# Patient Record
Sex: Female | Born: 1975 | Race: White | Hispanic: No | Marital: Married | Smoking: Never smoker
Health system: Southern US, Community
[De-identification: ages and names within clinical notes are randomized; demographics above are authoritative.]

## PROBLEM LIST (undated history)

## (undated) ENCOUNTER — Inpatient Hospital Stay (HOSPITAL_COMMUNITY): Payer: Self-pay

## (undated) DIAGNOSIS — O24313 Unspecified pre-existing diabetes mellitus in pregnancy, third trimester: Secondary | ICD-10-CM

## (undated) DIAGNOSIS — E0590 Thyrotoxicosis, unspecified without thyrotoxic crisis or storm: Secondary | ICD-10-CM

## (undated) DIAGNOSIS — O24419 Gestational diabetes mellitus in pregnancy, unspecified control: Secondary | ICD-10-CM

## (undated) DIAGNOSIS — O24420 Gestational diabetes mellitus in childbirth, diet controlled: Secondary | ICD-10-CM

## (undated) DIAGNOSIS — O139 Gestational [pregnancy-induced] hypertension without significant proteinuria, unspecified trimester: Secondary | ICD-10-CM

## (undated) DIAGNOSIS — Z98891 History of uterine scar from previous surgery: Secondary | ICD-10-CM

## (undated) HISTORY — PX: APPENDECTOMY: SHX54

---

## 2011-09-28 DIAGNOSIS — O139 Gestational [pregnancy-induced] hypertension without significant proteinuria, unspecified trimester: Secondary | ICD-10-CM

## 2011-09-28 DIAGNOSIS — O24419 Gestational diabetes mellitus in pregnancy, unspecified control: Secondary | ICD-10-CM

## 2011-09-28 HISTORY — DX: Gestational (pregnancy-induced) hypertension without significant proteinuria, unspecified trimester: O13.9

## 2011-09-28 HISTORY — DX: Gestational diabetes mellitus in pregnancy, unspecified control: O24.419

## 2012-05-17 DIAGNOSIS — O24419 Gestational diabetes mellitus in pregnancy, unspecified control: Secondary | ICD-10-CM

## 2012-05-17 DIAGNOSIS — O1490 Unspecified pre-eclampsia, unspecified trimester: Secondary | ICD-10-CM

## 2014-06-28 ENCOUNTER — Telehealth: Payer: Self-pay | Admitting: Obstetrics

## 2014-06-28 NOTE — Telephone Encounter (Signed)
Husband called to set up a NOB appt but pt has Family planing medicaid pt needs to change medicaid. LVM to husband on 09/18@1 :06pm   [place] [employer]

## 2014-07-07 ENCOUNTER — Inpatient Hospital Stay (HOSPITAL_COMMUNITY): Payer: Medicaid Other

## 2014-07-07 ENCOUNTER — Encounter (HOSPITAL_COMMUNITY): Payer: Self-pay | Admitting: *Deleted

## 2014-07-07 ENCOUNTER — Encounter (HOSPITAL_COMMUNITY)
Admission: AD | Disposition: A | Discharge status: Home / Self Care | Payer: Self-pay | Source: Ambulatory Visit | Attending: Family Medicine

## 2014-07-07 ENCOUNTER — Observation Stay (HOSPITAL_COMMUNITY)
Admission: AD | Admit: 2014-07-07 | Discharge: 2014-07-07 | Disposition: A | Discharge status: Home / Self Care | Payer: Medicaid Other | Source: Ambulatory Visit | Attending: Family Medicine | Admitting: Family Medicine

## 2014-07-07 ENCOUNTER — Observation Stay (HOSPITAL_COMMUNITY): Payer: Medicaid Other | Admitting: Anesthesiology

## 2014-07-07 ENCOUNTER — Encounter (HOSPITAL_COMMUNITY): Payer: Medicaid Other | Admitting: Anesthesiology

## 2014-07-07 DIAGNOSIS — Z3A01 Less than 8 weeks gestation of pregnancy: Secondary | ICD-10-CM | POA: Diagnosis not present

## 2014-07-07 DIAGNOSIS — O99011 Anemia complicating pregnancy, first trimester: Secondary | ICD-10-CM | POA: Insufficient documentation

## 2014-07-07 DIAGNOSIS — O034 Incomplete spontaneous abortion without complication: Secondary | ICD-10-CM

## 2014-07-07 DIAGNOSIS — O4690 Antepartum hemorrhage, unspecified, unspecified trimester: Secondary | ICD-10-CM

## 2014-07-07 DIAGNOSIS — O031 Delayed or excessive hemorrhage following incomplete spontaneous abortion: Principal | ICD-10-CM

## 2014-07-07 DIAGNOSIS — I9589 Other hypotension: Secondary | ICD-10-CM

## 2014-07-07 DIAGNOSIS — I959 Hypotension, unspecified: Secondary | ICD-10-CM | POA: Diagnosis present

## 2014-07-07 DIAGNOSIS — D62 Acute posthemorrhagic anemia: Secondary | ICD-10-CM

## 2014-07-07 DIAGNOSIS — R55 Syncope and collapse: Secondary | ICD-10-CM | POA: Diagnosis not present

## 2014-07-07 DIAGNOSIS — O209 Hemorrhage in early pregnancy, unspecified: Secondary | ICD-10-CM

## 2014-07-07 DIAGNOSIS — O34591 Maternal care for other abnormalities of gravid uterus, first trimester: Secondary | ICD-10-CM | POA: Insufficient documentation

## 2014-07-07 DIAGNOSIS — O99411 Diseases of the circulatory system complicating pregnancy, first trimester: Secondary | ICD-10-CM | POA: Insufficient documentation

## 2014-07-07 DIAGNOSIS — O9989 Other specified diseases and conditions complicating pregnancy, childbirth and the puerperium: Secondary | ICD-10-CM | POA: Insufficient documentation

## 2014-07-07 HISTORY — PX: DILATION AND EVACUATION: SHX1459

## 2014-07-07 LAB — CBC
HCT: 19.3 % — ABNORMAL LOW (ref 36.0–46.0)
HEMATOCRIT: 29.8 % — AB (ref 36.0–46.0)
HEMOGLOBIN: 6.4 g/dL — AB (ref 12.0–15.0)
Hemoglobin: 9.7 g/dL — ABNORMAL LOW (ref 12.0–15.0)
MCH: 26.9 pg (ref 26.0–34.0)
MCH: 27.4 pg (ref 26.0–34.0)
MCHC: 32.6 g/dL (ref 30.0–36.0)
MCHC: 33.2 g/dL (ref 30.0–36.0)
MCV: 82.5 fL (ref 78.0–100.0)
MCV: 82.8 fL (ref 78.0–100.0)
PLATELETS: 276 10*3/uL (ref 150–400)
Platelets: 218 10*3/uL (ref 150–400)
RBC: 2.34 MIL/uL — ABNORMAL LOW (ref 3.87–5.11)
RBC: 3.6 MIL/uL — AB (ref 3.87–5.11)
RDW: 14 % (ref 11.5–15.5)
RDW: 14.3 % (ref 11.5–15.5)
WBC: 15.2 10*3/uL — AB (ref 4.0–10.5)
WBC: 16.4 10*3/uL — ABNORMAL HIGH (ref 4.0–10.5)

## 2014-07-07 LAB — HCG, QUANTITATIVE, PREGNANCY: hCG, Beta Chain, Quant, S: 8610 m[IU]/mL — ABNORMAL HIGH (ref <5)

## 2014-07-07 LAB — ABO/RH: ABO/RH(D): A POS

## 2014-07-07 LAB — PREPARE RBC (CROSSMATCH)

## 2014-07-07 LAB — MRSA PCR SCREENING: MRSA BY PCR: NEGATIVE

## 2014-07-07 SURGERY — DILATION AND EVACUATION, UTERUS
Anesthesia: Monitor Anesthesia Care | Site: Uterus

## 2014-07-07 MED ORDER — ASPIRIN 300 MG RE SUPP
300.0000 mg | RECTAL | Status: DC
  Filled 2014-07-07: qty 1

## 2014-07-07 MED ORDER — LACTATED RINGERS IV BOLUS (SEPSIS)
1000.0000 mL | Freq: Once | INTRAVENOUS | Status: AC
  Administered 2014-07-07: 1000 mL via INTRAVENOUS

## 2014-07-07 MED ORDER — BUPIVACAINE-EPINEPHRINE 0.25% -1:200000 IJ SOLN
INTRAMUSCULAR | Status: DC | PRN
  Administered 2014-07-07: 10 mL

## 2014-07-07 MED ORDER — DOXYCYCLINE HYCLATE 100 MG PO TABS
100.0000 mg | ORAL_TABLET | Freq: Once | ORAL | Status: AC
  Administered 2014-07-07: 100 mg via ORAL
  Filled 2014-07-07: qty 1

## 2014-07-07 MED ORDER — LACTATED RINGERS IV SOLN
INTRAVENOUS | Status: DC
Start: 2014-07-07 — End: 2014-07-07

## 2014-07-07 MED ORDER — MIDAZOLAM HCL 5 MG/5ML IJ SOLN
INTRAMUSCULAR | Status: DC | PRN
  Administered 2014-07-07: 2 mg via INTRAVENOUS

## 2014-07-07 MED ORDER — MISOPROSTOL 200 MCG PO TABS
800.0000 ug | ORAL_TABLET | Freq: Once | ORAL | Status: AC
  Administered 2014-07-07: 800 ug via VAGINAL

## 2014-07-07 MED ORDER — PROPOFOL 10 MG/ML IV BOLUS
INTRAVENOUS | Status: DC | PRN
  Administered 2014-07-07 (×3): 50 mg via INTRAVENOUS

## 2014-07-07 MED ORDER — ONDANSETRON HCL 4 MG/2ML IJ SOLN
INTRAMUSCULAR | Status: AC
  Filled 2014-07-07: qty 2

## 2014-07-07 MED ORDER — PROPOFOL 10 MG/ML IV EMUL
INTRAVENOUS | Status: AC
Start: 2014-07-07 — End: 2014-07-07
  Filled 2014-07-07: qty 50

## 2014-07-07 MED ORDER — FERROUS SULFATE 325 (65 FE) MG PO TABS: 325.0000 mg | ORAL_TABLET | Freq: Two times a day (BID) | ORAL | Status: DC

## 2014-07-07 MED ORDER — LACTATED RINGERS IV SOLN
INTRAVENOUS | Status: DC
  Administered 2014-07-07: 04:00:00 via INTRAVENOUS

## 2014-07-07 MED ORDER — LIDOCAINE HCL (CARDIAC) 20 MG/ML IV SOLN
INTRAVENOUS | Status: AC
  Filled 2014-07-07: qty 5

## 2014-07-07 MED ORDER — FENTANYL CITRATE 0.05 MG/ML IJ SOLN
INTRAMUSCULAR | Status: AC
  Filled 2014-07-07: qty 2

## 2014-07-07 MED ORDER — FENTANYL CITRATE 0.05 MG/ML IJ SOLN
INTRAMUSCULAR | Status: DC | PRN
  Administered 2014-07-07 (×2): 50 ug via INTRAVENOUS

## 2014-07-07 MED ORDER — LIDOCAINE HCL (CARDIAC) 20 MG/ML IV SOLN
INTRAVENOUS | Status: DC | PRN
  Administered 2014-07-07: 40 mg via INTRAVENOUS

## 2014-07-07 MED ORDER — PROMETHAZINE HCL 25 MG/ML IJ SOLN: 6.2500 mg | INTRAMUSCULAR | Status: DC | PRN

## 2014-07-07 MED ORDER — SODIUM CHLORIDE 0.9 % IV SOLN
250.0000 mL | INTRAVENOUS | Status: DC | PRN
Start: 2014-07-07 — End: 2014-07-07

## 2014-07-07 MED ORDER — MEPERIDINE HCL 25 MG/ML IJ SOLN: 6.2500 mg | INTRAMUSCULAR | Status: DC | PRN

## 2014-07-07 MED ORDER — MIDAZOLAM HCL 2 MG/2ML IJ SOLN
INTRAMUSCULAR | Status: AC
  Filled 2014-07-07: qty 2

## 2014-07-07 MED ORDER — OXYCODONE-ACETAMINOPHEN 5-325 MG PO TABS: 2.0000 | ORAL_TABLET | ORAL | Status: DC | PRN

## 2014-07-07 MED ORDER — SODIUM CHLORIDE 0.9 % IV SOLN
1020.0000 mg | Freq: Once | INTRAVENOUS | Status: AC
  Administered 2014-07-07: 1020 mg via INTRAVENOUS
  Filled 2014-07-07: qty 34

## 2014-07-07 MED ORDER — SODIUM CHLORIDE 0.9 % IV SOLN: Freq: Once | INTRAVENOUS | Status: DC

## 2014-07-07 MED ORDER — BUPIVACAINE HCL (PF) 0.25 % IJ SOLN
INTRAMUSCULAR | Status: AC
  Filled 2014-07-07: qty 30

## 2014-07-07 MED ORDER — LACTATED RINGERS IV SOLN: INTRAVENOUS | Status: DC

## 2014-07-07 MED ORDER — FENTANYL CITRATE 0.05 MG/ML IJ SOLN: 25.0000 ug | INTRAMUSCULAR | Status: DC | PRN

## 2014-07-07 MED ORDER — DOXYCYCLINE HYCLATE 100 MG IV SOLR
100.0000 mg | Freq: Once | INTRAVENOUS | Status: AC
  Administered 2014-07-07: 100 mg via INTRAVENOUS
  Filled 2014-07-07: qty 100

## 2014-07-07 MED ORDER — SODIUM CHLORIDE 0.9 % IV SOLN
INTRAVENOUS | Status: DC
  Administered 2014-07-07 (×3): via INTRAVENOUS

## 2014-07-07 MED ORDER — IBUPROFEN 600 MG PO TABS
600.0000 mg | ORAL_TABLET | Freq: Four times a day (QID) | ORAL | Status: DC
  Administered 2014-07-07: 600 mg via ORAL
  Filled 2014-07-07: qty 1

## 2014-07-07 MED ORDER — ASPIRIN 81 MG PO CHEW
324.0000 mg | CHEWABLE_TABLET | ORAL | Status: DC
  Filled 2014-07-07: qty 4

## 2014-07-07 MED ORDER — MISOPROSTOL 200 MCG PO TABS
ORAL_TABLET | ORAL | Status: AC
  Filled 2014-07-07: qty 4

## 2014-07-07 MED ORDER — LACTATED RINGERS IV SOLN
INTRAVENOUS | Status: DC
  Administered 2014-07-07: 08:00:00 via INTRAVENOUS

## 2014-07-07 SURGICAL SUPPLY — 19 items
CATH ROBINSON RED A/P 16FR (CATHETERS) ×3 IMPLANT
CLOTH BEACON ORANGE TIMEOUT ST (SAFETY) ×3 IMPLANT
DECANTER SPIKE VIAL GLASS SM (MISCELLANEOUS) ×3 IMPLANT
GLOVE BIOGEL PI IND STRL 7.0 (GLOVE) ×1 IMPLANT
GLOVE BIOGEL PI INDICATOR 7.0 (GLOVE) ×2
GLOVE ECLIPSE 7.5 STRL STRAW (GLOVE) ×6 IMPLANT
GOWN STRL REUS W/TWL LRG LVL3 (GOWN DISPOSABLE) ×9 IMPLANT
KIT BERKELEY 1ST TRIMESTER 3/8 (MISCELLANEOUS) ×3 IMPLANT
NS IRRIG 1000ML POUR BTL (IV SOLUTION) ×3 IMPLANT
PACK VAGINAL MINOR WOMEN LF (CUSTOM PROCEDURE TRAY) ×3 IMPLANT
PAD OB MATERNITY 4.3X12.25 (PERSONAL CARE ITEMS) ×3 IMPLANT
PAD PREP 24X48 CUFFED NSTRL (MISCELLANEOUS) ×3 IMPLANT
SET BERKELEY SUCTION TUBING (SUCTIONS) ×3 IMPLANT
SLEEVE SCD COMPRESS KNEE MED (MISCELLANEOUS) ×3 IMPLANT
TOWEL OR 17X24 6PK STRL BLUE (TOWEL DISPOSABLE) ×6 IMPLANT
VACURETTE 10 RIGID CVD (CANNULA) IMPLANT
VACURETTE 7MM CVD STRL WRAP (CANNULA) IMPLANT
VACURETTE 8 RIGID CVD (CANNULA) IMPLANT
VACURETTE 9 RIGID CVD (CANNULA) IMPLANT

## 2014-07-07 NOTE — Progress Notes (Signed)
Margarita MailW. Karim CNM in to discuss probable SAB but waiting for u/s report. Pt asking questions regarding plan of care. Spouse at bedside.

## 2014-07-07 NOTE — Anesthesia Postprocedure Evaluation (Signed)
  Anesthesia Post-op Note  Patient: Colleen Crawford  Procedure(s) Performed: Procedure(s): DILATATION AND EVACUATION (N/A)  Patient Location: ICU  Anesthesia Type:MAC  Level of Consciousness: awake, alert  and oriented  Airway and Oxygen Therapy: Patient Spontanous Breathing  Post-op Pain: none  Post-op Assessment: Post-op Vital signs reviewed, Patient's Cardiovascular Status Stable, Respiratory Function Stable, No signs of Nausea or vomiting and Pain level controlled  Post-op Vital Signs: Reviewed and stable  Last Vitals:  Filed Vitals:   07/07/14 0827  BP: 97/57  Pulse: 108  Temp:   Resp:     Complications: No apparent anesthesia complications

## 2014-07-07 NOTE — Progress Notes (Signed)
07/07/14 0402  Vitals  BP ! 114/91 mmHg  MAP (mmHg) 96  BP Location Right arm  BP Method Automatic  Patient Position (if appropriate) Lying  Pulse Rate 89  Oxygen Therapy  SpO2 100 %  O2 Device None (Room air)  Pt transferred to OR via bed.

## 2014-07-07 NOTE — Progress Notes (Signed)
Patient ID: Colleen Crawford, female   DOB: 12/14/1975, 38 y.o.   MRN: [id number]  Patient continues to have symptomatic hypotension despite cytotec and IV boluses.  Will preform D&E.  Risks discussed with patient, including bleeding, cramping, pain, scarring, uterine perforation.  PT NPO since 10pm, will remain NPO during procedure.  Return to ICU following surgery for close monitoring.  Doxycycline ordered preoperatively.  Candelaria CelesteJacob Edwena Mayorga, DO Attending Physician Faculty Practice, [facility] 07/07/2014 4:02 AM

## 2014-07-07 NOTE — Addendum Note (Signed)
Addendum created 07/07/14 0859 by Shanon PayorSuzanne M Brenae Lasecki, CRNA   Modules edited: Notes Section   Notes Section:  File: [id number]

## 2014-07-07 NOTE — Anesthesia Preprocedure Evaluation (Addendum)
Anesthesia Evaluation  Patient identified by MRN, date of birth, ID band Patient awake    Reviewed: Allergy & Precautions, H&P , NPO status , Patient's Chart, lab work & pertinent test results  Airway Mallampati: II TM Distance: >3 FB Neck ROM: Full    Dental no notable dental hx.    Pulmonary neg pulmonary ROS,  breath sounds clear to auscultation  Pulmonary exam normal       Cardiovascular negative cardio ROS  Rhythm:Regular Rate:Normal     Neuro/Psych negative neurological ROS  negative psych ROS   GI/Hepatic negative GI ROS, Neg liver ROS,   Endo/Other  negative endocrine ROS  Renal/GU negative Renal ROS  negative genitourinary   Musculoskeletal negative musculoskeletal ROS (+)   Abdominal   Peds negative pediatric ROS (+)  Hematology negative hematology ROS (+)   Anesthesia Other Findings   Reproductive/Obstetrics negative OB ROS                          Anesthesia Physical Anesthesia Plan  ASA: II and emergent  Anesthesia Plan: MAC   Post-op Pain Management:    Induction:   Airway Management Planned:   Additional Equipment:   Intra-op Plan:   Post-operative Plan:   Informed Consent: I have reviewed the patients History and Physical, chart, labs and discussed the procedure including the risks, benefits and alternatives for the proposed anesthesia with the patient or authorized representative who has indicated his/her understanding and acceptance.   Dental advisory given  Plan Discussed with: CRNA  Anesthesia Plan Comments:        Anesthesia Quick Evaluation

## 2014-07-07 NOTE — Discharge Instructions (Signed)

## 2014-07-07 NOTE — Op Note (Signed)
Carmina Leyland PROCEDURE DATE: 07/07/2014  PREOPERATIVE DIAGNOSIS: 9934w1d incomplete abortion, heavy vaginal bleeding, hypotension POSTOPERATIVE DIAGNOSIS: The same. PROCEDURE:     Dilation and Evacuation. SURGEON:  Dr. Candelaria CelesteJacob Stinson  INDICATIONS: 38 y.o. G2P1001with MAB at 4734w1d gestation, needing surgical completion.  Risks of surgery were discussed with the patient including but not limited to: bleeding which may require transfusion; infection which may require antibiotics; injury to uterus or surrounding organs;need for additional procedures including laparotomy or laparoscopy; possibility of intrauterine scarring which may impair future fertility; and other postoperative/anesthesia complications. Written informed consent was obtained.    FINDINGS:  A 7 size anteverted/midline/retroverted uterus, moderate amounts of products of conception, specimen sent to pathology.  ANESTHESIA:    Monitored intravenous sedation, paracervical block. INTRAVENOUS FLUIDS:  500 ml of LR ESTIMATED BLOOD LOSS: 100mL. SPECIMENS:  Products of conception sent to pathology COMPLICATIONS:  None immediate.  PROCEDURE DETAILS:  The patient received intravenous antibiotics while in the preoperative area.  She was then taken to the operating room where general anesthesia was administered and was found to be adequate.  After a timeout was performed, she was placed in the dorsal lithotomy position and examined; then prepped and draped in the sterile manner.   Her bladder was catheterized for an unmeasured amount of clear, yellow urine. A vaginal speculum was then placed in the patient's vagina, revealing some products of conception at the cervical os, which were removed.  A paracervical block using 1% Marcaine was administered.  A single tooth tenaculum was then applied to the anterior lip of the cervix. The cervix was gently dilated to accommodate a 7 mm suction curette that was gently advanced to the uterine fundus.  The suction  device was then activated and curette slowly rotated to clear the uterus of products of conception.  A sharp curettage was then performed to confirm complete emptying of the uterus.There was minimal bleeding noted and the tenaculum removed with good hemostasis noted.  The patient tolerated the procedure well.  The patient was taken to the recovery area in stable condition.

## 2014-07-07 NOTE — Progress Notes (Signed)
To AICU via stretcher

## 2014-07-07 NOTE — Progress Notes (Signed)
MD at bedside, pt informed MD that she now agrees to have surgery.  MD discussed the plan of care,pt prepped for surgery.

## 2014-07-07 NOTE — Progress Notes (Signed)
This note also relates to the following rows which could not be included: Pulse Rate - Cannot attach notes to unvalidated device data SpO2 - Cannot attach notes to unvalidated device data    07/07/14 0400  Oxygen Therapy  O2 Device None (Room air)  Blood and Surgical  Consent signed and witness

## 2014-07-07 NOTE — MAU Note (Signed)
Vag bleeding since 2245. Denies any pain. (Brought in via ems)Alert and oriented.

## 2014-07-07 NOTE — Progress Notes (Signed)
Pt discharged home with husband and child via W/C, accompanied by RN.  VSS and  pt ambulating in room without complaints. Discharge education discussed and pt verbalized understanding.

## 2014-07-07 NOTE — Progress Notes (Signed)
07/07/14 0338  Vitals  Temp 98.5 F (36.9 C)  Temp Source Oral  BP ! 51/24 mmHg  MAP (mmHg) 30  BP Location Right arm  BP Method Automatic  Patient Position (if appropriate) Lying  Pulse Rate 97  Resp 20  Oxygen Therapy  SpO2 100 %  O2 Device None (Room air)  MD paged and informed. Order received for LR 1 litre bolus. Order carried out.

## 2014-07-07 NOTE — H&P (Signed)
Faculty Practice Antenatal History and Physical  Colleen PolkaSaida Levey ZOX:[id number] DOB: 03-06-1976 DOA: 07/07/2014  Chief Complaint: Vaginal bleeding  HPI: Colleen Crawford is a 38 y.o. female G3P1001 with IUP at 10042w1d presenting for vaginal bleeding that started at 10:30 PM on 07/06/2014. She was brought to the hospital by EMS due to large amount of bleeding. While in the emergency department she became hypotensive and fainted twice, but was revived fairly quickly. The patient receives 2 L boluses of lactate Ringer. Ultrasound shows an incomplete AB with a significant amount of subchorionic hemorrhage. The patient has minimal abdominal pain and cramping, which started here in the hospital. No palliating or provoking factors.  Dating: 6 weeks and one day by ultrasound today  Review of Systems:   Pt denies any fevers, chills, nausea, vomiting.  Review of systems are otherwise negative  Prenatal History/Complications: None  Past Medical History: Past Medical History  Diagnosis Date  . Medical history non-contributory     Past Surgical History: Past Surgical History  Procedure Laterality Date  . Cesarean section    . Appendectomy      Obstetrical History: OB History   Grav Para Term Preterm Abortions TAB SAB Ect Mult Living   3 1 1  0 0 0 0 0 0 1      Social History: History   Social History  . Marital Status: Married    Spouse Name: N/A    Number of Children: N/A  . Years of Education: N/A   Social History Main Topics  . Smoking status: Never Smoker   . Smokeless tobacco: None  . Alcohol Use: No  . Drug Use: No  . Sexual Activity: Yes   Other Topics Concern  . None   Social History Narrative  . None    Family History: Family History  Problem Relation Age of Onset  . Alcohol abuse Neg Hx     Allergies: No Known Allergies  Prescriptions prior to admission  Medication Sig Dispense Refill  . Prenatal Vit-Fe Fumarate-FA (PRENATAL MULTIVITAMIN) TABS tablet Take 1  tablet by mouth daily at 12 noon.        Physical Exam: BP 84/52  Pulse 87  Resp 20  SpO2 98%  LMP 04/19/2014  General appearance: alert, cooperative and no distress Lungs: clear to auscultation bilaterally Heart: regular rate and rhythm, S1, S2 normal, no murmur, click, rub or gallop Abdomen: soft, non-tender; bowel sounds normal; no masses,  no organomegaly Extremities: extremities normal, atraumatic, no cyanosis or edema Pulses: 2+ and symmetric            Labs on Admission:  Basic Metabolic Panel: No results found for this basename: NA, K, CL, CO2, GLUCOSE, BUN, CREATININE, CALCIUM, MG, PHOS,  in the last 168 hours Liver Function Tests: No results found for this basename: AST, ALT, ALKPHOS, BILITOT, PROT, ALBUMIN,  in the last 168 hours No results found for this basename: LIPASE, AMYLASE,  in the last 168 hours No results found for this basename: AMMONIA,  in the last 168 hours CBC:  Recent Labs Lab 07/07/14 0030  WBC 15.2*  HGB 9.7*  HCT 29.8*  MCV 82.8  PLT 276    CBG: No results found for this basename: GLUCAP,  in the last 168 hours  Radiological Exams on Admission: [place] Ob Comp Less 14 Wks  07/07/2014   CLINICAL DATA:  Heavy vaginal bleeding.  Initial encounter.  EXAM: OBSTETRIC <14 WK US AND TRANSVAGINAL OB US  TECHNIQUE: Both transabdominal and transvaginal  ultrasound examinations were performed for complete evaluation of the gestation as well as the maternal uterus, adnexal regions, and pelvic cul-de-sac. Transvaginal technique was performed to assess early pregnancy.  COMPARISON:  None.  FINDINGS: Intrauterine gestational sac: Visualized; the gestational sac is diffusely elongated, and extends to the level of the cervix.  Yolk sac:  Yes  Embryo:  Yes  Cardiac Activity: No  Heart Rate: N/A  CRL:   4.2  mm   6 w 1 d                  US EDC: 03/01/2015  Maternal uterus/adnexae: A moderate amount of subchorionic hemorrhage is noted.  The ovaries are within normal  limits. The right ovary measures 3.0 x 1.5 x 1.7 cm, while the left ovary measures 2.9 x 1.1 x 1.7 cm. No suspicious adnexal masses are seen; there is no evidence of ovarian torsion.  No free fluid is seen within the pelvic cul-de-sac.  IMPRESSION: 1. The patient's intrauterine gestational sac is diffusely elongated, and extends to the level of the cervix. An embryo is seen, but no cardiac activity is noted at this time. Given the markedly abnormal appearance of the gestational sac, this likely reflects spontaneous abortion in progress. 2. Moderate amount of subchorionic hemorrhage noted.   Electronically Signed   By: Roanna RaiderJeffery  Chang M.D.   On: 07/07/2014 02:01   [place] Ob Transvaginal  07/07/2014   CLINICAL DATA:  Heavy vaginal bleeding.  Initial encounter.  EXAM: OBSTETRIC <14 WK US AND TRANSVAGINAL OB US  TECHNIQUE: Both transabdominal and transvaginal ultrasound examinations were performed for complete evaluation of the gestation as well as the maternal uterus, adnexal regions, and pelvic cul-de-sac. Transvaginal technique was performed to assess early pregnancy.  COMPARISON:  None.  FINDINGS: Intrauterine gestational sac: Visualized; the gestational sac is diffusely elongated, and extends to the level of the cervix.  Yolk sac:  Yes  Embryo:  Yes  Cardiac Activity: No  Heart Rate: N/A  CRL:   4.2  mm   6 w 1 d                  US EDC: 03/01/2015  Maternal uterus/adnexae: A moderate amount of subchorionic hemorrhage is noted.  The ovaries are within normal limits. The right ovary measures 3.0 x 1.5 x 1.7 cm, while the left ovary measures 2.9 x 1.1 x 1.7 cm. No suspicious adnexal masses are seen; there is no evidence of ovarian torsion.  No free fluid is seen within the pelvic cul-de-sac.  IMPRESSION: 1. The patient's intrauterine gestational sac is diffusely elongated, and extends to the level of the cervix. An embryo is seen, but no cardiac activity is noted at this time. Given the markedly abnormal appearance of  the gestational sac, this likely reflects spontaneous abortion in progress. 2. Moderate amount of subchorionic hemorrhage noted.   Electronically Signed   By: Roanna RaiderJeffery  Chang M.D.   On: 07/07/2014 02:01     Assessment/Plan  Problem List Items Addressed This Visit   Hypotension    Other Visit Diagnoses   Vaginal bleeding in pregnancy    -  Primary    Relevant Orders       US OB Transvaginal (Completed)    Incomplete miscarriage        Acute posthemorrhagic anemia           We'll admit the patient for blood pressure monitoring an IV fluids as the patient continues to be hypotensive. The  patient received Cytotec 800 mcg per rectum. I did discuss with the patient options of doing a dilation and evacuation versus continuing with management with Cytotec. Should the patient continued to be hypotensive due to continued vaginal bleeding, I feel that proceeding with a dilation and evacuation will be necessary. Will continue with IV fluids, patient will remain n.p.o.   Code Status: full  Time spent: 70 minutes  Candelaria Celeste, DO Faculty Practice Attending Physician [facility] Attending Phone #: [phone]  **Disclaimer: This note may have been dictated with voice recognition software. Similar sounding words can inadvertently be transcribed and this note may contain transcription errors which may not have been corrected upon publication of note.**

## 2014-07-07 NOTE — MAU Note (Signed)
Pt felt faint again with cramping. Had large amt bleeding with clots. Margarita MailW. karim CNM at bedside and spec exam done. Pericare given. Cytotec placed vaginally by CNM. Dr Adrian BlackwaterStinson in using [place] interpreter to explain u/s results of miscarriage in progress and plan of care options.

## 2014-07-07 NOTE — MAU Note (Signed)
Went in to see pt and was talking to pt. Pt pale. Suddenly became unresponsive. HOB down. IV line of LR added to SL L AC previously established by EMS. Margarita MailW. Karim CNM called to come to room. When pt's HOB down pt was lethargic but did respond. Perineum and legs cleaned which were very bloody. Margarita MailW. Karim CNM called Dr Adrian BlackwaterStinson who came to bedside to see pt. Over 1-2 mins of flds starting pt felt better and responding to questions.

## 2014-07-07 NOTE — MAU Provider Note (Signed)
History     CSN: [id number]  Arrival date and time: 07/07/14 [id number]   First Provider Initiated Contact with Patient 07/07/14 0028      Chief Complaint  Patient presents with  . Vaginal Bleeding   Vaginal Bleeding    Pt is a 38 yo G2P1001 at 12 wk IUP by pt stated LMP brought in via EMS for heavy vaginal bleeding at home.  Pt denies pelvic pain.  Bleeding started at approximately   Past Medical History  Diagnosis Date  . Medical history non-contributory     Past Surgical History  Procedure Laterality Date  . Cesarean section    . Appendectomy      Family History  Problem Relation Age of Onset  . Alcohol abuse Neg Hx     History  Substance Use Topics  . Smoking status: Never Smoker   . Smokeless tobacco: Not on file  . Alcohol Use: No    Allergies: No Known Allergies  Prescriptions prior to admission  Medication Sig Dispense Refill  . Prenatal Vit-Fe Fumarate-FA (PRENATAL MULTIVITAMIN) TABS tablet Take 1 tablet by mouth daily at 12 noon.        Review of Systems  Genitourinary: Positive for vaginal bleeding.  Neurological: Positive for dizziness and sensory change. Negative for loss of consciousness.   Physical Exam   Blood pressure 103/62, pulse 90, resp. rate 20, last menstrual period 04/19/2014, SpO2 99.00%. Filed Vitals:   07/07/14 0040 07/07/14 0044 07/07/14 0045 07/07/14 0046  BP: 98/62 96/71 96/63  103/62  Pulse: 84 90 103 90  Resp: 20     SpO2: 97%  99%     Physical Exam  Constitutional: She appears well-developed and well-nourished. She appears lethargic. No distress.  HENT:  Head: Normocephalic.  Neck: Normal range of motion. Neck supple.  Cardiovascular: Normal rate, regular rhythm and normal heart sounds.   Respiratory: Effort normal and breath sounds normal. No respiratory distress.  GI: Soft. There is tenderness (with palpation).  Genitourinary: There is bleeding (large amount of vaginal bleeding; multiple clots teased out of vaginal  vault with ring forceps) around the vagina.  Neurological: She appears lethargic. GCS eye subscore is 4. GCS verbal subscore is 4. GCS motor subscore is 6.  Skin: Skin is dry. There is pallor.    MAU Course  Procedures Bed placed in trendelenburg  2 IV lines placed with LR open Stat bedside ultrasound ordered Type and hold for 2 units ordered stat Dr. Adrian BlackwaterStinson called to bedside 800 mcg cytotec placed rectally  Results for orders placed during the hospital encounter of 07/07/14 (from the past 24 hour(s))  CBC     Status: Abnormal   Collection Time    07/07/14 12:30 AM      Result Value Ref Range   WBC 15.2 (*) 4.0 - 10.5 K/uL   RBC 3.60 (*) 3.87 - 5.11 MIL/uL   Hemoglobin 9.7 (*) 12.0 - 15.0 g/dL   HCT 11.929.8 (*) 14.736.0 - 82.946.0 %   MCV 82.8  78.0 - 100.0 fL   MCH 26.9  26.0 - 34.0 pg   MCHC 32.6  30.0 - 36.0 g/dL   RDW 56.214.0  13.011.5 - 86.515.5 %   Platelets 276  150 - 400 K/uL  TYPE AND SCREEN     Status: None   Collection Time    07/07/14 12:30 AM      Result Value Ref Range   ABO/RH(D) A POS     Antibody Screen NEG  Sample Expiration 07/10/2014     Unit Number [id number]     Blood Component Type RED CELLS,LR     Unit division 00     Status of Unit ALLOCATED     Transfusion Status OK TO TRANSFUSE     Crossmatch Result Compatible     Unit Number [id number]     Blood Component Type RED CELLS,LR     Unit division 00     Status of Unit ALLOCATED     Transfusion Status OK TO TRANSFUSE     Crossmatch Result Compatible    HCG, QUANTITATIVE, PREGNANCY     Status: Abnormal   Collection Time    07/07/14 12:30 AM      Result Value Ref Range   hCG, Beta Chain, Quant, S 8610 (*) <5 mIU/mL  ABO/RH     Status: None   Collection Time    07/07/14 12:30 AM      Result Value Ref Range   ABO/RH(D) A POS    PREPARE RBC (CROSSMATCH)     Status: None   Collection Time    07/07/14  1:00 AM      Result Value Ref Range   Order Confirmation ORDER PROCESSED BY BLOOD BANK      Ultrasound: FINDINGS:  Intrauterine gestational sac: Visualized; the gestational sac is  diffusely elongated, and extends to the level of the cervix.  Yolk sac: Yes  Embryo: Yes  Cardiac Activity: No  Heart Rate: N/A  CRL: 4.2 mm 6 w 1 d US EDC: 03/01/2015  Maternal uterus/adnexae: A moderate amount of subchorionic  hemorrhage is noted.  The ovaries are within normal limits. The right ovary measures 3.0 x  1.5 x 1.7 cm, while the left ovary measures 2.9 x 1.1 x 1.7 cm. No  suspicious adnexal masses are seen; there is no evidence of ovarian  torsion.  No free fluid is seen within the pelvic cul-de-sac.  IMPRESSION:  1. The patient's intrauterine gestational sac is diffusely  elongated, and extends to the level of the cervix. An embryo is  seen, but no cardiac activity is noted at this time. Given the  markedly abnormal appearance of the gestational sac, this likely  reflects spontaneous abortion in progress.  2. Moderate amount of subchorionic hemorrhage noted.   Assessment and Plan  Incomplete Miscarriage  Plan: Admit to hospital Orders entered by Dr. Tobias AlexanderStinson  Colleen Crawford Colleen GainN Crawford, CNM

## 2014-07-07 NOTE — Plan of Care (Signed)
Problem: Discharge Progression Outcomes Goal: Complications resolved/controlled Outcome: Completed/Met Date Met:  07/07/14 No further vaginal bleeding

## 2014-07-07 NOTE — MAU Note (Signed)
Bedside u/s being done

## 2014-07-07 NOTE — Discharge Summary (Signed)
Physician Discharge Summary  Patient ID: Colleen Crawford MRN: [id number] DOB/AGE: 37-Mar-1977 37 y.o.  Admit date: 07/07/2014 Discharge date: 07/07/2014  Admission Diagnoses:  Discharge Diagnoses:  Active Problems:   Hypotension   Discharged Condition: good  Hospital Course: patient admitted with vaginal bleeding, incomplete ab, multiple syncopal episodes, hypotension.  Subsequently taken for D&E after patient had syncopal episode while lying in bed.  Consults: None  Significant Diagnostic Studies: labs: and radiology   Labs: Results for orders placed during the hospital encounter of 07/07/14 (from the past 24 hour(s))  CBC   Collection Time    07/07/14 12:30 AM      Result Value Ref Range   WBC 15.2 (*) 4.0 - 10.5 K/uL   RBC 3.60 (*) 3.87 - 5.11 MIL/uL   Hemoglobin 9.7 (*) 12.0 - 15.0 g/dL   HCT 62.929.8 (*) 52.836.0 - 41.346.0 %   MCV 82.8  78.0 - 100.0 fL   MCH 26.9  26.0 - 34.0 pg   MCHC 32.6  30.0 - 36.0 g/dL   RDW 24.414.0  01.011.5 - 27.215.5 %   Platelets 276  150 - 400 K/uL  HCG, QUANTITATIVE, PREGNANCY   Collection Time    07/07/14 12:30 AM      Result Value Ref Range   hCG, Beta Chain, Quant, S 8610 (*) <5 mIU/mL  TYPE AND SCREEN   Collection Time    07/07/14 12:30 AM      Result Value Ref Range   ABO/RH(D) A POS     Antibody Screen NEG     Sample Expiration 07/10/2014     Unit Number [id number]     Blood Component Type RED CELLS,LR     Unit division 00     Status of Unit ALLOCATED     Transfusion Status OK TO TRANSFUSE     Crossmatch Result Compatible     Unit Number [id number]     Blood Component Type RED CELLS,LR     Unit division 00     Status [facility]     Transfusion Status OK TO TRANSFUSE     Crossmatch Result Compatible     Unit Number [id number]     Blood Component Type RED CELLS,LR     Unit division 00     Status of Unit ALLOCATED     Transfusion Status OK TO TRANSFUSE     Crossmatch Result Compatible    ABO/RH   Collection Time     07/07/14 12:30 AM      Result Value Ref Range   ABO/RH(D) A POS    PREPARE RBC (CROSSMATCH)   Collection Time    07/07/14  1:00 AM      Result Value Ref Range   Order Confirmation ORDER PROCESSED BY BLOOD BANK    MRSA PCR SCREENING   Collection Time    07/07/14  3:50 AM      Result Value Ref Range   MRSA by PCR NEGATIVE  NEGATIVE  CBC   Collection Time    07/07/14  7:05 AM      Result Value Ref Range   WBC 16.4 (*) 4.0 - 10.5 K/uL   RBC 2.34 (*) 3.87 - 5.11 MIL/uL   Hemoglobin 6.4 (*) 12.0 - 15.0 g/dL   HCT 60.119.3 (*) 09.336.0 - 23.546.0 %   MCV 82.5  78.0 - 100.0 fL   MCH 27.4  26.0 - 34.0 pg   MCHC 33.2  30.0 - 36.0 g/dL  RDW 14.3  11.5 - 15.5 %   Platelets 218  150 - 400 K/uL    07/07/2014   CLINICAL DATA:  Heavy vaginal bleeding.  Initial encounter.  EXAM: OBSTETRIC <14 WK US AND TRANSVAGINAL OB US  TECHNIQUE: Both transabdominal and transvaginal ultrasound examinations were performed for complete evaluation of the gestation as well as the maternal uterus, adnexal regions, and pelvic cul-de-sac. Transvaginal technique was performed to assess early pregnancy.  COMPARISON:  None.  FINDINGS: Intrauterine gestational sac: Visualized; the gestational sac is diffusely elongated, and extends to the level of the cervix.  Yolk sac:  Yes  Embryo:  Yes  Cardiac Activity: No  Heart Rate: N/A  CRL:   4.2  mm   6 w 1 d                  US EDC: 03/01/2015  Maternal uterus/adnexae: A moderate amount of subchorionic hemorrhage is noted.  The ovaries are within normal limits. The right ovary measures 3.0 x 1.5 x 1.7 cm, while the left ovary measures 2.9 x 1.1 x 1.7 cm. No suspicious adnexal masses are seen; there is no evidence of ovarian torsion.  No free fluid is seen within the pelvic cul-de-sac.  IMPRESSION: 1. The patient's intrauterine gestational sac is diffusely elongated, and extends to the level of the cervix. An embryo is seen, but no cardiac activity is noted at this time. Given the markedly  abnormal appearance of the gestational sac, this likely reflects spontaneous abortion in progress. 2. Moderate amount of subchorionic hemorrhage noted.   Electronically Signed   By: Roanna RaiderJeffery  Chang M.D.   On: 07/07/2014 02:01    Treatments: D&E, IV feraheme 1020mg , IV fluids  Discharge Exam: Blood pressure 113/68, pulse 96, temperature 99.5 F (37.5 C), temperature source Oral, resp. rate 16, height 5' (1.524 m), weight 148 lb 9.6 oz (67.405 kg), last menstrual period 04/19/2014, SpO2 100.00%. General appearance: alert and cooperative Head: Normocephalic, without obvious abnormality, atraumatic Resp: no respiratory distress Cardio: regular rate at rest GI: soft, non-tender; bowel sounds normal; no masses,  no organomegaly Extremities: extremities normal, atraumatic, no cyanosis or edema Skin: Skin color, texture, turgor normal. No rashes or lesions  Disposition: Final discharge disposition not confirmed  Discharge Instructions   Call MD for:  severe uncontrolled pain    Complete by:  As directed      Call MD for:  temperature >100.4    Complete by:  As directed      Diet - low sodium heart healthy    Complete by:  As directed          also see patient instructions for more detailed explanation of postop care   Medication List         ferrous sulfate 325 (65 FE) MG tablet  Commonly known as:  FERROUSUL  Take 1 tablet (325 mg total) by mouth 2 (two) times daily with a meal.     prenatal multivitamin Tabs tablet  Take 1 tablet by mouth daily at 12 noon.           Follow-up Information   Follow up with [facility] In 4 weeks. (for follow up visit)    Contact information:   [address] [place] [address] [phone]        Signed: Perry MountCOSTA,Alizee Maple ROCIO 07/07/2014, 4:41 PM

## 2014-07-07 NOTE — MAU Note (Signed)
Pt began having some cramping and felt like b/p was dropping. Noted vag bleeding heavier with mod clot. PT became unresponsive. Margarita MailW. Karim CNM called who immed. Came in to see pt. Dr Adrian BlackwaterStinson soon arrived and spec exam done. Second IV started R hand. Pt more responsive over next 2mins. Large amt bleeding with mod clots. Perinuem cleaned afterward. Warm blankets to pt

## 2014-07-07 NOTE — Anesthesia Postprocedure Evaluation (Signed)
  Anesthesia Post-op Note  Patient: Colleen Crawford  Procedure(s) Performed: Procedure(s) (LRB): DILATATION AND EVACUATION (N/A)  Patient Location: PACU  Anesthesia Type: MAC  Level of Consciousness: awake and alert   Airway and Oxygen Therapy: Patient Spontanous Breathing  Post-op Pain: mild  Post-op Assessment: Post-op Vital signs reviewed, Patient's Cardiovascular Status Stable, Respiratory Function Stable, Patent Airway and No signs of Nausea or vomiting  Last Vitals:  Filed Vitals:   07/07/14 0545  BP: 106/63  Pulse: 83  Temp:   Resp: 14    Post-op Vital Signs: stable   Complications: No apparent anesthesia complications

## 2014-07-07 NOTE — Progress Notes (Signed)
Results for Colleen Crawford, Myron (MRN [id number]) as of 07/07/2014 15:19  Ref. Range 07/07/2014 07:05  WBC Latest Range: 4.0-10.5 K/uL 16.4 (H)  RBC Latest Range: 3.87-5.11 MIL/uL 2.34 (L)  Hemoglobin Latest Range: 12.0-15.0 g/dL 6.4 (LL)  HCT Latest Range: 36.0-46.0 % 19.3 (L)  MCV Latest Range: 78.0-100.0 fL 82.5  MCH Latest Range: 26.0-34.0 pg 27.4  MCHC Latest Range: 30.0-36.0 g/dL 40.933.2  RDW Latest Range: 11.5-15.5 % 14.3  Platelets Latest Range: 150-400 K/uL 218  Results given to Dr. Adrian BlackwaterStinson, while in the unit on rounds. Will continue to observe pt. Closely.

## 2014-07-07 NOTE — Transfer of Care (Signed)
Immediate Anesthesia Transfer of Care Note  Patient: Colleen Crawford  Procedure(s) Performed: Procedure(s): DILATATION AND EVACUATION (N/A)  Patient Location: PACU  Anesthesia Type:MAC  Level of Consciousness: sedated  Airway & Oxygen Therapy: Patient Spontanous Breathing  Post-op Assessment: Report given to PACU RN and Post -op Vital signs reviewed and stable  Post vital signs: stable  Complications: No apparent anesthesia complications

## 2014-07-08 ENCOUNTER — Encounter (HOSPITAL_COMMUNITY): Payer: Self-pay | Admitting: Family Medicine

## 2014-07-08 NOTE — MAU Provider Note (Signed)
Attestation of Attending Supervision of Advanced Practitioner (PA/CNM/NP): Evaluation and management procedures were performed by the Advanced Practitioner under my supervision and collaboration.  I have reviewed the Advanced Practitioner's note and chart, and I agree with the management and plan.  Rainna Nearhood, DO Attending Physician Faculty Practice, [facility]  

## 2014-07-11 LAB — TYPE AND SCREEN
ABO/RH(D): A POS
Antibody Screen: NEGATIVE
UNIT DIVISION: 0
UNIT DIVISION: 0
Unit division: 0

## 2014-07-29 ENCOUNTER — Encounter (HOSPITAL_COMMUNITY): Payer: Self-pay | Admitting: Family Medicine

## 2014-08-14 ENCOUNTER — Encounter: Payer: Self-pay | Admitting: Obstetrics & Gynecology

## 2014-08-14 ENCOUNTER — Ambulatory Visit (INDEPENDENT_AMBULATORY_CARE_PROVIDER_SITE_OTHER): Payer: Medicaid Other | Admitting: Obstetrics & Gynecology

## 2014-08-14 VITALS — BP 125/65 | HR 73 | Temp 97.9°F | Ht 60.0 in | Wt 146.9 lb

## 2014-08-14 DIAGNOSIS — N949 Unspecified condition associated with female genital organs and menstrual cycle: Secondary | ICD-10-CM

## 2014-08-14 DIAGNOSIS — Z09 Encounter for follow-up examination after completed treatment for conditions other than malignant neoplasm: Secondary | ICD-10-CM

## 2014-08-14 NOTE — Patient Instructions (Signed)
Return to clinic for any scheduled appointments or for any gynecologic concerns as needed.   

## 2014-08-14 NOTE — Progress Notes (Signed)
   CLINIC ENCOUNTER NOTE  History:  38 y.o. G2P1001 here today for postop encounter after D&E on 07/07/14 for incomplete abortion. She is Arabic speaking, [facility] interpreter [phone] used.  Denies any bleeding but reports tingling sensation in her vagina noticed before her procedure but now has worsened.  Not related to sexual intercourse or urination. No other concerns.  The following portions of the patient's history were reviewed and updated as appropriate: allergies, current medications, past family history, past medical history, past social history, past surgical history and problem list.  Normal [facility] and negative [facility] 05[facility] [place]   Review of Systems:  Pertinent items are noted in HPI.  Objective:  Physical Exam BP 125/65 mmHg  Pulse 73  Temp(Src) 97.9 F (36.6 C)  Ht 5' (1.524 m)  Wt 146 lb 14.4 oz (66.633 kg)  BMI 28.69 kg/m2 Gen: NAD Abd: Soft, nontender and nondistended Pelvic: Normal appearing external genitalia; normal appearing vaginal mucosa and cervix. Scant white discharge seen, wet prep obtained.    Surgical pathology 07/07/14  IMMATURE CHORIONIC VILLI, CONSISTENT WITH PRODUCTS OF CONCEPTION  Assessment & Plan:  Normal postop exam, patient coping well. Normal vulvar exam, will follow up wet prep results and manage accordingly. Routine preventative health maintenance measures emphasized.   Jaynie CollinsUGONNA  Karrina Lye, MD, FACOG Attending Obstetrician & Gynecologist Center for [facility]

## 2014-08-14 NOTE — Progress Notes (Signed)
Interpreter ID# [id number] used for this encounter. C/o of tingling sensation in vaginal area.

## 2014-08-15 ENCOUNTER — Telehealth: Payer: Self-pay

## 2014-08-15 LAB — WET PREP, GENITAL
Clue Cells Wet Prep HPF POC: NONE SEEN
Trich, Wet Prep: NONE SEEN
WBC, Wet Prep HPF POC: NONE SEEN
Yeast Wet Prep HPF POC: NONE SEEN

## 2014-08-15 NOTE — Telephone Encounter (Signed)
[facility]ed to contact [facility] with [facility] interpreter [phone] [id number]. No answer. Left message stating we are calling with results, non-urgent, please call clinic.

## 2014-08-15 NOTE — Telephone Encounter (Signed)
-----   Message from Tereso NewcomerUgonna A Anyanwu, MD sent at 08/15/2014  9:00 AM EST ----- Negative wet prep. Please call to inform patient of results.  Arabic speaking

## 2014-08-19 ENCOUNTER — Encounter: Payer: Self-pay | Admitting: General Practice

## 2014-08-19 NOTE — Telephone Encounter (Signed)
Called patient, no answer- left message that we are trying to reach you with some non urgent results, please call us back at the clinics. Will send letter

## 2015-09-30 ENCOUNTER — Inpatient Hospital Stay (HOSPITAL_COMMUNITY)
Admission: AD | Admit: 2015-09-30 | Discharge: 2015-09-30 | Disposition: A | Discharge status: Home / Self Care | Payer: Medicaid Other | Source: Ambulatory Visit | Attending: Obstetrics and Gynecology | Admitting: Obstetrics and Gynecology

## 2015-09-30 ENCOUNTER — Encounter (HOSPITAL_COMMUNITY): Payer: Self-pay | Admitting: *Deleted

## 2015-09-30 ENCOUNTER — Inpatient Hospital Stay (HOSPITAL_COMMUNITY): Payer: Medicaid Other

## 2015-09-30 DIAGNOSIS — O26899 Other specified pregnancy related conditions, unspecified trimester: Secondary | ICD-10-CM

## 2015-09-30 DIAGNOSIS — O209 Hemorrhage in early pregnancy, unspecified: Secondary | ICD-10-CM | POA: Insufficient documentation

## 2015-09-30 DIAGNOSIS — R109 Unspecified abdominal pain: Secondary | ICD-10-CM | POA: Insufficient documentation

## 2015-09-30 DIAGNOSIS — Z3A08 8 weeks gestation of pregnancy: Secondary | ICD-10-CM | POA: Diagnosis not present

## 2015-09-30 DIAGNOSIS — O9989 Other specified diseases and conditions complicating pregnancy, childbirth and the puerperium: Secondary | ICD-10-CM | POA: Diagnosis not present

## 2015-09-30 LAB — CBC
HCT: 39.8 % (ref 36.0–46.0)
Hemoglobin: 13.3 g/dL (ref 12.0–15.0)
MCH: 27.5 pg (ref 26.0–34.0)
MCHC: 33.4 g/dL (ref 30.0–36.0)
MCV: 82.2 fL (ref 78.0–100.0)
PLATELETS: 334 10*3/uL (ref 150–400)
RBC: 4.84 MIL/uL (ref 3.87–5.11)
RDW: 14.4 % (ref 11.5–15.5)
WBC: 15 10*3/uL — ABNORMAL HIGH (ref 4.0–10.5)

## 2015-09-30 LAB — URINE MICROSCOPIC-ADD ON: Bacteria, UA: NONE SEEN

## 2015-09-30 LAB — WET PREP, GENITAL
Clue Cells Wet Prep HPF POC: NONE SEEN
Sperm: NONE SEEN
TRICH WET PREP: NONE SEEN
Yeast Wet Prep HPF POC: NONE SEEN

## 2015-09-30 LAB — URINALYSIS, ROUTINE W REFLEX MICROSCOPIC
Bilirubin Urine: NEGATIVE
Glucose, UA: NEGATIVE mg/dL
Ketones, ur: NEGATIVE mg/dL
Leukocytes, UA: NEGATIVE
Nitrite: NEGATIVE
Protein, ur: 30 mg/dL — AB
Specific Gravity, Urine: <1.005 — ABNORMAL LOW (ref 1.005–1.030)
pH: 6 (ref 5.0–8.0)

## 2015-09-30 LAB — ABO/RH: ABO/RH(D): A POS

## 2015-09-30 LAB — POCT PREGNANCY, URINE: Preg Test, Ur: POSITIVE — AB

## 2015-09-30 LAB — HCG, QUANTITATIVE, PREGNANCY: HCG, BETA CHAIN, QUANT, S: 22539 m[IU]/mL — AB (ref <5)

## 2015-09-30 NOTE — MAU Provider Note (Signed)
History     CSN: [id number]  Arrival date and time: 09/30/15 1743   First Provider Initiated Contact with Patient 09/30/15 1851       Chief Complaint  Patient presents with  . Abdominal Pain  . Vaginal Bleeding   HPI  Colleen Crawford is a 40 y.o. G3P1011 at [redacted]w[redacted]d who presents for abdominal cramping & vaginal bleeding.  Reports lower abdominal cramping & pink spotting since yesterday after having intercourse. Some nausea. Denies vomiting, diarrhea, or constipation.  Denies urinary complaints.  Had nurse visit with [facility] & first prenatal visit scheduled next week.  OB History    Gravida Para Term Preterm AB TAB SAB Ectopic Multiple Living   3 1 1  0 1 0 1 0 0 1      Past Medical History  Diagnosis Date  . Medical history non-contributory     Past Surgical History  Procedure Laterality Date  . Cesarean section    . Appendectomy    . Dilation and evacuation N/A 07/07/2014    Procedure: DILATATION AND EVACUATION;  Surgeon: Levie Heritage, DO;  Location: [facility];  Service: Gynecology;  Laterality: N/A;    Family History  Problem Relation Age of Onset  . Alcohol abuse Neg Hx     Social History  Substance Use Topics  . Smoking status: Never Smoker   . Smokeless tobacco: Never Used  . Alcohol Use: No    Allergies: No Known Allergies  Prescriptions prior to admission  Medication Sig Dispense Refill Last Dose  . ferrous sulfate (FERROUSUL) 325 (65 FE) MG tablet Take 1 tablet (325 mg total) by mouth 2 (two) times daily with a meal. 60 tablet 3 Not Taking  . Prenatal Vit-Fe Fumarate-FA (PRENATAL MULTIVITAMIN) TABS tablet Take 1 tablet by mouth daily at 12 noon.   Taking    Review of Systems  Constitutional: Negative.   Gastrointestinal: Positive for nausea and abdominal pain. Negative for vomiting, diarrhea and constipation.  Genitourinary: Negative for dysuria.       + vaginal bleeding   Physical Exam   Blood pressure 135/85, pulse 74, temperature 98.3  F (36.8 C), temperature source Oral, resp. rate 18, height 5' 0.5" (1.537 m), weight 150 lb 12.8 oz (68.402 kg), last menstrual period 08/03/2015, unknown if currently breastfeeding.  Physical Exam  Nursing note and vitals reviewed. Constitutional: She is oriented to person, place, and time. She appears well-developed and well-nourished. No distress.  HENT:  Head: Normocephalic and atraumatic.  Eyes: Conjunctivae are normal. Right eye exhibits no discharge. Left eye exhibits no discharge. No scleral icterus.  Neck: Normal range of motion.  Cardiovascular: Normal rate, regular rhythm and normal heart sounds.   No murmur heard. Respiratory: Effort normal and breath sounds normal. No respiratory distress. She has no wheezes.  GI: Soft. Bowel sounds are normal. She exhibits no distension. There is no tenderness.  Genitourinary:  Small amount of brown mucoid discharge Cervix closed  Neurological: She is alert and oriented to person, place, and time.  Skin: Skin is warm and dry. She is not diaphoretic.  Psychiatric: She has a normal mood and affect. Her behavior is normal. Judgment and thought content normal.    MAU Course  Procedures Results for orders placed or performed during the hospital encounter of 09/30/15 (from the past 24 hour(s))  Urinalysis, Routine w reflex microscopic (not at [facility])     Status: Abnormal   Collection Time: 09/30/15  6:05 PM  Result Value Ref Range  Color, Urine YELLOW YELLOW   APPearance CLEAR CLEAR   Specific Gravity, Urine <1.005 (L) 1.005 - 1.030   pH 6.0 5.0 - 8.0   Glucose, UA NEGATIVE NEGATIVE mg/dL   Hgb urine dipstick LARGE (A) NEGATIVE   Bilirubin Urine NEGATIVE NEGATIVE   Ketones, ur NEGATIVE NEGATIVE mg/dL   Protein, ur 30 (A) NEGATIVE mg/dL   Nitrite NEGATIVE NEGATIVE   Leukocytes, UA NEGATIVE NEGATIVE  Urine microscopic-add on     Status: Abnormal   Collection Time: 09/30/15  6:05 PM  Result Value Ref Range   Squamous Epithelial / LPF  0-5 (A) NONE SEEN   WBC, UA 0-5 0 - 5 WBC/hpf   RBC / HPF 6-30 0 - 5 RBC/hpf   Bacteria, UA NONE SEEN NONE SEEN  Pregnancy, urine POC     Status: Abnormal   Collection Time: 09/30/15  6:20 PM  Result Value Ref Range   Preg Test, Ur POSITIVE (A) NEGATIVE  CBC     Status: Abnormal   Collection Time: 09/30/15  6:36 PM  Result Value Ref Range   WBC 15.0 (H) 4.0 - 10.5 K/uL   RBC 4.84 3.87 - 5.11 MIL/uL   Hemoglobin 13.3 12.0 - 15.0 g/dL   HCT 08.6 57.8 - 46.9 %   MCV 82.2 78.0 - 100.0 fL   MCH 27.5 26.0 - 34.0 pg   MCHC 33.4 30.0 - 36.0 g/dL   RDW 62.9 52.8 - 41.3 %   Platelets 334 150 - 400 K/uL  ABO/Rh     Status: None   Collection Time: 09/30/15  6:36 PM  Result Value Ref Range   ABO/RH(D) A POS   hCG, quantitative, pregnancy     Status: Abnormal   Collection Time: 09/30/15  6:36 PM  Result Value Ref Range   hCG, Beta Chain, Quant, S 22539 (H) <5 mIU/mL  Wet prep, genital     Status: Abnormal   Collection Time: 09/30/15  8:10 PM  Result Value Ref Range   Yeast Wet Prep HPF POC NONE SEEN NONE SEEN   Trich, Wet Prep NONE SEEN NONE SEEN   Clue Cells Wet Prep HPF POC NONE SEEN NONE SEEN   WBC, Wet Prep HPF POC FEW (A) NONE SEEN   Sperm NONE SEEN    US Ob Comp Less 14 Wks  09/30/2015  CLINICAL DATA:  Spotting for 1 day. LMP 08/03/2015. Gravida 3 para 1 SAB 1. By LMP patient is 8 weeks 2 days. EDC by LMP is 05/09/2016. EXAM: OBSTETRIC <14 WK [place] AND TRANSVAGINAL OB US TECHNIQUE: Both transabdominal and transvaginal ultrasound examinations were performed for complete evaluation of the gestation as well as the maternal uterus, adnexal regions, and pelvic cul-de-sac. Transvaginal technique was performed to assess early pregnancy. COMPARISON:  None. FINDINGS: Intrauterine gestational sac: Present Yolk sac:  Visualized Embryo: Not definitively seen. Possible double bleb sign versus 2 yolk sacs present. Cardiac Activity: Not seen MSD: 18.2  mm   6 w   5  d Subchorionic hemorrhage:  None seen  Maternal uterus/adnexae: Right corpus luteum cyst is 1.5 x 1.3 x 1.0 cm. Left ovary has a normal appearance. IMPRESSION: 1. Intrauterine gestational sac. 2. Possible double bleb sign within the gestational sac versus 2 yolk sacs. Follow-up ultrasound is suggested in 10-14 days to confirm presence of fetal pole and to determine presence or absence of twin gestation. 3. Small right corpus luteum cyst. Electronically Signed   By: Norva Pavlov M.D.   On: 09/30/2015  19:57   [place] Ob Transvaginal  09/30/2015  CLINICAL DATA:  Spotting for 1 day. LMP 08/03/2015. Gravida 3 para 1 SAB 1. By LMP patient is 8 weeks 2 days. EDC by LMP is 05/09/2016. EXAM: OBSTETRIC <14 WK US AND TRANSVAGINAL OB US TECHNIQUE: Both transabdominal and transvaginal ultrasound examinations were performed for complete evaluation of the gestation as well as the maternal uterus, adnexal regions, and pelvic cul-de-sac. Transvaginal technique was performed to assess early pregnancy. COMPARISON:  None. FINDINGS: Intrauterine gestational sac: Present Yolk sac:  Visualized Embryo: Not definitively seen. Possible double bleb sign versus 2 yolk sacs present. Cardiac Activity: Not seen MSD: 18.2  mm   6 w   5  d Subchorionic hemorrhage:  None seen Maternal uterus/adnexae: Right corpus luteum cyst is 1.5 x 1.3 x 1.0 cm. Left ovary has a normal appearance. IMPRESSION: 1. Intrauterine gestational sac. 2. Possible double bleb sign within the gestational sac versus 2 yolk sacs. Follow-up ultrasound is suggested in 10-14 days to confirm presence of fetal pole and to determine presence or absence of twin gestation. 3. Small right corpus luteum cyst. Electronically Signed   By: Norva PavlovElizabeth  Brown M.D.   On: 09/30/2015 19:57    MDM A positive Ultrasound shows IUGS with yolk sac 2038- S/w Dr. Mindi SlickerBanga. Discussed ultrasound findings & exam. Ok to discharge home & f/u as scheduled.   Assessment and Plan  A: 1. Abdominal pain in pregnancy   2. Vaginal bleeding in  pregnancy, first trimester     P: Discharge home Pelvic rest until prenatal visit Discussed reasons to return to MAU  Judeth HornErin Candido Flott, NP  09/30/2015, 6:51 PM

## 2015-09-30 NOTE — MAU Note (Addendum)
'  not very well, having some pain in lower and some blood drops."  First appt is next week

## 2015-09-30 NOTE — Discharge Instructions (Signed)
Pelvic Rest °Pelvic rest is sometimes recommended for women when:  °· The placenta is partially or completely covering the opening of the cervix (placenta previa). °· There is bleeding between the uterine wall and the amniotic sac in the first trimester (subchorionic hemorrhage). °· The cervix begins to open without labor starting (incompetent cervix, cervical insufficiency). °· The labor is too early (preterm labor). °HOME CARE INSTRUCTIONS °· Do not have sexual intercourse, stimulation, or an orgasm. °· Do not use tampons, douche, or put anything in the vagina. °· Do not lift anything over 10 pounds (4.5 kg). °· Avoid strenuous activity or straining your pelvic muscles. °SEEK MEDICAL CARE IF:  °· You have any vaginal bleeding during pregnancy. Treat this as a potential emergency. °· You have cramping pain felt low in the stomach (stronger than menstrual cramps). °· You notice vaginal discharge (watery, mucus, or bloody). °· You have a low, dull backache. °· There are regular contractions or uterine tightening. °SEEK IMMEDIATE MEDICAL CARE IF: °You have vaginal bleeding and have placenta previa.  °  °This information is not intended to replace advice given to you by your health care provider. Make sure you discuss any questions you have with your health care provider. °  °Document Released: 01/08/2011 Document Revised: 12/06/2011 Document Reviewed: 03/17/2015 °Elsevier Interactive Patient Education ©2016 Elsevier Inc. ° °Vaginal Bleeding During Pregnancy, First Trimester °A small amount of bleeding (spotting) from the vagina is relatively common in early pregnancy. It usually stops on its own. Various things may cause bleeding or spotting in early pregnancy. Some bleeding may be related to the pregnancy, and some may not. In most cases, the bleeding is normal and is not a problem. However, bleeding can also be a sign of something serious. Be sure to tell your health care provider about any vaginal bleeding right  away. °Some possible causes of vaginal bleeding during the first trimester include: °· Infection or inflammation of the cervix. °· Growths (polyps) on the cervix. °· Miscarriage or threatened miscarriage. °· Pregnancy tissue has developed outside of the uterus and in a fallopian tube (tubal pregnancy). °· Tiny cysts have developed in the uterus instead of pregnancy tissue (molar pregnancy). °HOME CARE INSTRUCTIONS  °Watch your condition for any changes. The following actions may help to lessen any discomfort you are feeling: °· Follow your health care provider's instructions for limiting your activity. If your health care provider orders bed rest, you may need to stay in bed and only get up to use the bathroom. However, your health care provider may allow you to continue light activity. °· If needed, make plans for someone to help with your regular activities and responsibilities while you are on bed rest. °· Keep track of the number of pads you use each day, how often you change pads, and how soaked (saturated) they are. Write this down. °· Do not use tampons. Do not douche. °· Do not have sexual intercourse or orgasms until approved by your health care provider. °· If you pass any tissue from your vagina, save the tissue so you can show it to your health care provider. °· Only take over-the-counter or prescription medicines as directed by your health care provider. °· Do not take aspirin because it can make you bleed. °· Keep all follow-up appointments as directed by your health care provider. °SEEK MEDICAL CARE IF: °· You have any vaginal bleeding during any part of your pregnancy. °· You have cramps or labor pains. °· You have a fever, not   controlled by medicine. °SEEK IMMEDIATE MEDICAL CARE IF:  °· You have severe cramps in your back or belly (abdomen). °· You pass large clots or tissue from your vagina. °· Your bleeding increases. °· You feel light-headed or weak, or you have fainting episodes. °· You have  chills. °· You are leaking fluid or have a gush of fluid from your vagina. °· You pass out while having a bowel movement. °MAKE SURE YOU: °· Understand these instructions. °· Will watch your condition. °· Will get help right away if you are not doing well or get worse. °  °This information is not intended to replace advice given to you by your health care provider. Make sure you discuss any questions you have with your health care provider. °  °Document Released: 06/23/2005 Document Revised: 09/18/2013 Document Reviewed: 05/21/2013 °Elsevier Interactive Patient Education ©2016 Elsevier Inc. ° °

## 2015-10-01 LAB — GC/CHLAMYDIA PROBE AMP (~~LOC~~) NOT AT ARMC
CHLAMYDIA, DNA PROBE: NEGATIVE
Neisseria Gonorrhea: NEGATIVE

## 2015-10-01 LAB — HIV ANTIBODY (ROUTINE TESTING W REFLEX): HIV Screen 4th Generation wRfx: NONREACTIVE

## 2015-10-02 ENCOUNTER — Encounter (HOSPITAL_COMMUNITY): Payer: Self-pay | Admitting: *Deleted

## 2015-10-02 ENCOUNTER — Inpatient Hospital Stay (HOSPITAL_COMMUNITY)
Admission: AD | Admit: 2015-10-02 | Discharge: 2015-10-02 | Disposition: A | Discharge status: Home / Self Care | Payer: Medicaid Other | Source: Ambulatory Visit | Attending: Obstetrics and Gynecology | Admitting: Obstetrics and Gynecology

## 2015-10-02 DIAGNOSIS — Z3A08 8 weeks gestation of pregnancy: Secondary | ICD-10-CM | POA: Insufficient documentation

## 2015-10-02 DIAGNOSIS — N939 Abnormal uterine and vaginal bleeding, unspecified: Secondary | ICD-10-CM | POA: Insufficient documentation

## 2015-10-02 DIAGNOSIS — O034 Incomplete spontaneous abortion without complication: Secondary | ICD-10-CM | POA: Diagnosis not present

## 2015-10-02 LAB — URINALYSIS, ROUTINE W REFLEX MICROSCOPIC
BILIRUBIN URINE: NEGATIVE
Glucose, UA: NEGATIVE mg/dL
Ketones, ur: NEGATIVE mg/dL
Leukocytes, UA: NEGATIVE
Nitrite: NEGATIVE
Protein, ur: 100 mg/dL — AB
SPECIFIC GRAVITY, URINE: 1.015 (ref 1.005–1.030)
pH: 6.5 (ref 5.0–8.0)

## 2015-10-02 LAB — URINE MICROSCOPIC-ADD ON: WBC UA: NONE SEEN WBC/hpf (ref 0–5)

## 2015-10-02 LAB — HCG, QUANTITATIVE, PREGNANCY: hCG, Beta Chain, Quant, S: 18717 m[IU]/mL — ABNORMAL HIGH (ref <5)

## 2015-10-02 LAB — CBC
HCT: 41.6 % (ref 36.0–46.0)
Hemoglobin: 13.6 g/dL (ref 12.0–15.0)
MCH: 27.4 pg (ref 26.0–34.0)
MCHC: 32.7 g/dL (ref 30.0–36.0)
MCV: 83.9 fL (ref 78.0–100.0)
PLATELETS: 353 10*3/uL (ref 150–400)
RBC: 4.96 MIL/uL (ref 3.87–5.11)
RDW: 14.7 % (ref 11.5–15.5)
WBC: 19.5 10*3/uL — AB (ref 4.0–10.5)

## 2015-10-02 MED ORDER — IBUPROFEN 600 MG PO TABS
600.0000 mg | ORAL_TABLET | Freq: Once | ORAL | Status: AC
  Administered 2015-10-02: 600 mg via ORAL
  Filled 2015-10-02: qty 1

## 2015-10-02 MED ORDER — IBUPROFEN 600 MG PO TABS: 600.0000 mg | ORAL_TABLET | Freq: Once | ORAL | Status: DC

## 2015-10-02 NOTE — MAU Note (Signed)
Pt reports she started bleeding and cramping since yesterday.

## 2015-10-02 NOTE — MAU Provider Note (Signed)
  History     CSN: [id number]  Arrival date and time: 10/02/15 1805   None     Chief Complaint  Patient presents with  . Vaginal Bleeding   HPI Earlean PolkaSaida Hiser 40 y.o. G3P1011 @[redacted]w[redacted]d  presents to MAU complaining of heavy vaginal bleeding and abdominal cramping.  She has had more mild symptoms the last couple days but this afternoon her bleeding became much more heavy.  She is very afraid that she is having a miscarriage and is tearful.  She denies fever, nausea, vomiting, weakness.  She has not taken any medication.   OB History    Gravida Para Term Preterm AB TAB SAB Ectopic Multiple Living   3 1 1  0 1 0 1 0 0 1      Past Medical History  Diagnosis Date  . Medical history non-contributory     Past Surgical History  Procedure Laterality Date  . Cesarean section    . Appendectomy    . Dilation and evacuation N/A 07/07/2014    Procedure: DILATATION AND EVACUATION;  Surgeon: Levie HeritageJacob J Stinson, DO;  Location: [facility];  Service: Gynecology;  Laterality: N/A;    Family History  Problem Relation Age of Onset  . Alcohol abuse Neg Hx     Social History  Substance Use Topics  . Smoking status: Never Smoker   . Smokeless tobacco: Never Used  . Alcohol Use: No    Allergies: No Known Allergies  Prescriptions prior to admission  Medication Sig Dispense Refill Last Dose  . Prenatal Vit-Fe Fumarate-FA (PRENATAL MULTIVITAMIN) TABS tablet Take 1 tablet by mouth daily at 12 noon.   09/30/2015 at Unknown time    ROS Pertinent ROS in HPI.  All other systems are negative.   Physical Exam   Blood pressure 148/84, pulse 89, temperature 98.4 F (36.9 C), temperature source Oral, resp. rate 18, height 5' 0.5" (1.537 m), weight 151 lb 12.8 oz (68.856 kg), last menstrual period 08/03/2015, unknown if currently breastfeeding.  Physical Exam  Constitutional: She is oriented to person, place, and time. She appears well-developed and well-nourished. No distress.  HENT:  Head: Normocephalic and  atraumatic.  Eyes: Conjunctivae and EOM are normal.  Neck: Normal range of motion. Neck supple.  Cardiovascular: Normal rate and normal heart sounds.   Respiratory: Effort normal. No respiratory distress.  GI: Soft. She exhibits no distension. There is no tenderness.  Genitourinary:  Large amt of dark red blood.  Tissue present that appears like possible products of conception.  This was collected and sent for surgical pathology.    Neurological: She is alert and oriented to person, place, and time.  Skin: Skin is warm and dry.  Psychiatric: She has a normal mood and affect. Her behavior is normal.    MAU Course  Procedures  MDM HCG, CBC ordered.   2 days ago hcg was >22,000.  Today, 18,000.   Pt declines medication for pain/discomfort.   When giving final instructions, pt requesting pain medication. Ibuprofen ordered.   Assessment and Plan  A: Vaginal bleeding in pregnancy Incomplete AB  P: Discharge to home F/u in office in 1-2 weeks.  Bleeding precautions given Ibuprofen PRN Patient may return to MAU as needed or if her condition were to change or worsen     Bertram DenverKaren E Teague Clark 10/02/2015, 8:50 PM

## 2015-10-02 NOTE — Discharge Instructions (Signed)
Incomplete Miscarriage A miscarriage is the sudden loss of an unborn baby (fetus) before the 20th week of pregnancy. In an incomplete miscarriage, parts of the fetus or placenta (afterbirth) remain in the body.  Having a miscarriage can be an emotional experience. Talk with your health care provider about any questions you may have about miscarrying, the grieving process, and your future pregnancy plans. CAUSES   Problems with the fetal chromosomes that make it impossible for the baby to develop normally. Problems with the baby's genes or chromosomes are most often the result of errors that occur by chance as the embryo divides and grows. The problems are not inherited from the parents.  Infection of the cervix or uterus.  Hormone problems.  Problems with the cervix, such as having an incompetent cervix. This is when the tissue in the cervix is not strong enough to hold the pregnancy.  Problems with the uterus, such as an abnormally shaped uterus, uterine fibroids, or congenital abnormalities.  Certain medical conditions.  Smoking, drinking alcohol, or taking illegal drugs.  Trauma. SYMPTOMS   Vaginal bleeding or spotting, with or without cramps or pain.  Pain or cramping in the abdomen or lower back.  Passing fluid, tissue, or blood clots from the vagina. DIAGNOSIS  Your health care provider will perform a physical exam. You may also have an ultrasound to confirm the miscarriage. Blood or urine tests may also be ordered. TREATMENT   Usually, a dilation and curettage (D&C) procedure is performed. During a D&C procedure, the cervix is widened (dilated) and any remaining fetal or placental tissue is gently removed from the uterus.  Antibiotic medicines are prescribed if there is an infection. Other medicines may be given to reduce the size of the uterus (contract) if there is a lot of bleeding.  If you have Rh negative blood and your baby was Rh positive, you will need a Rho (D)  immune globulin shot. This shot will protect any future baby from having Rh blood problems in future pregnancies.  You may be confined to bed rest. This means you should stay in bed and only get up to use the bathroom. HOME CARE INSTRUCTIONS   Rest as directed by your health care provider.  Restrict activity as directed by your health care provider. You may be allowed to continue light activity if curettage was not done but you require further treatment.  Keep track of the number of pads you use each day. Keep track of how soaked (saturated) they are. Record this information.  Do not  use tampons.  Do not douche or have sexual intercourse until approved by your health care provider.  Keep all follow-up appointments for reevaluation and continuing management.  Only take over-the-counter or prescription medicines for pain, fever, or discomfort as directed by your health care provider.  Take antibiotic medicine as directed by your health care provider. Make sure you finish it even if you start to feel better. SEEK IMMEDIATE MEDICAL CARE IF:   You experience severe cramps in your stomach, back, or abdomen.  You have an unexplained temperature (make sure to record these temperatures).  You pass large clots or tissue (save these for your health care provider to inspect).  Your bleeding increases.  You become light-headed, weak, or have fainting episodes. MAKE SURE YOU:   Understand these instructions.  Will watch your condition.  Will get help right away if you are not doing well or get worse.   This information is not intended to   replace advice given to you by your health care provider. Make sure you discuss any questions you have with your health care provider.   Document Released: 09/13/2005 Document Revised: 10/04/2014 Document Reviewed: 04/12/2013 Elsevier Interactive Patient Education 2016 Elsevier Inc.  

## 2016-03-20 IMAGING — US US OB COMP LESS 14 WK
1 series · 13 of 28 positions shown · non-contrast
Comparison: None.

CLINICAL DATA: Heavy vaginal bleeding.  Initial encounter.

EXAM:
OBSTETRIC <14 WK US AND TRANSVAGINAL OB US
TECHNIQUE: Both transabdominal and transvaginal ultrasound examinations were
performed for complete evaluation of the gestation as well as the
maternal uterus, adnexal regions, and pelvic cul-de-sac.
Transvaginal technique was performed to assess early pregnancy.

[Series 1: us ob comp less 14 wks · 52 acquisitions, 13 frames shown]
[im 2/52]
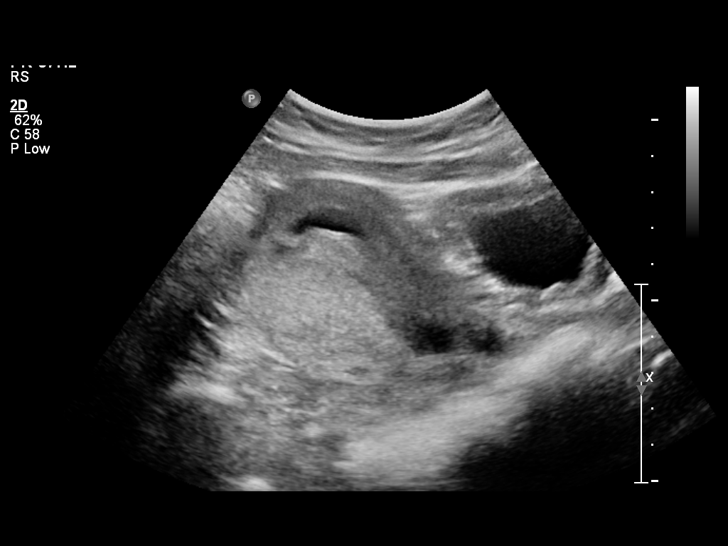
[im 6/52]
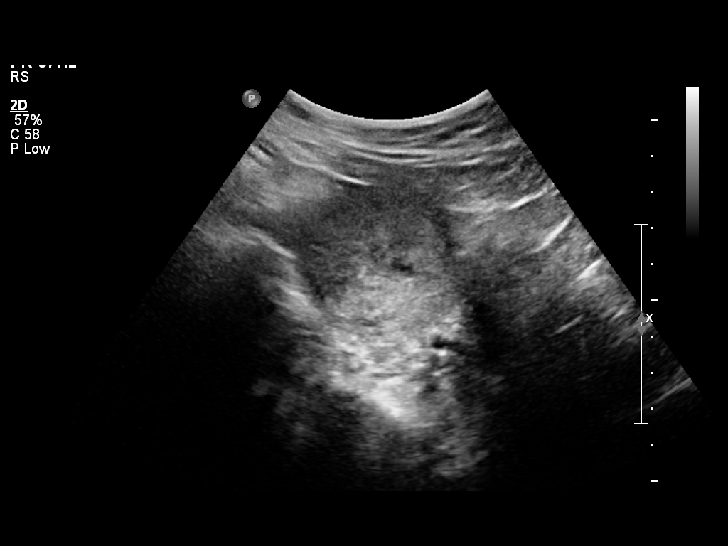
[im 10/52]
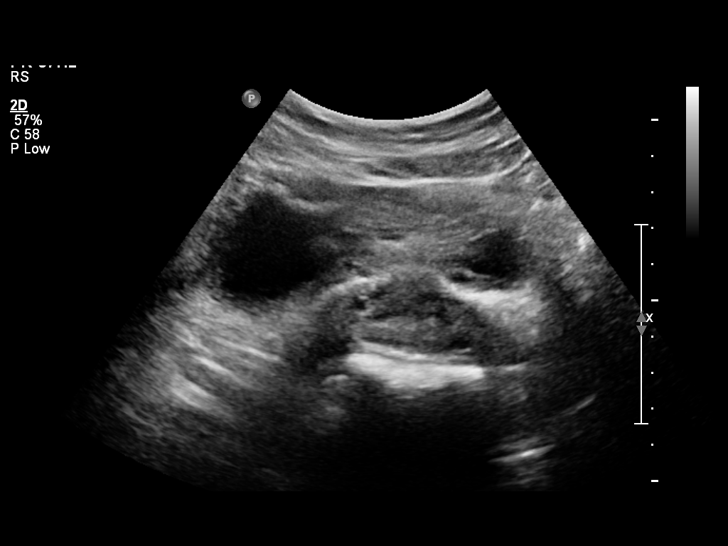
[im 14/52]
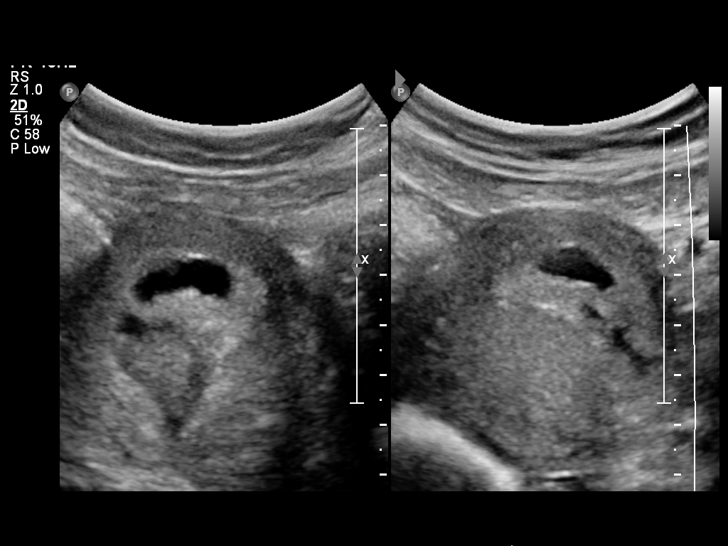
[im 18/52]
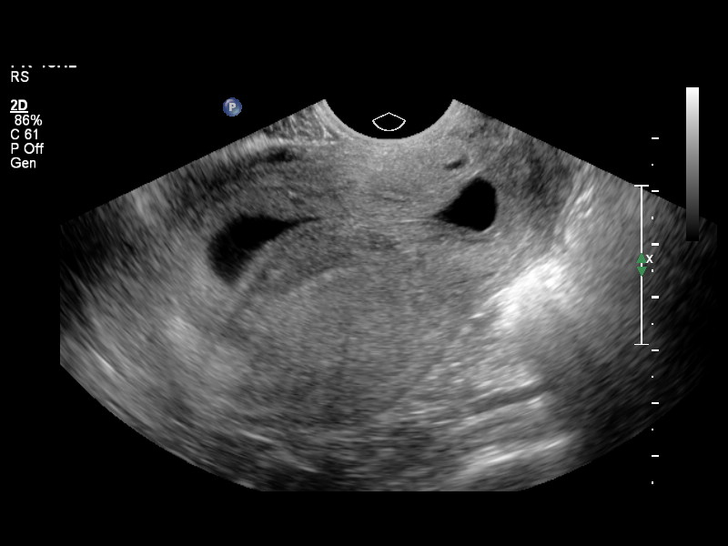
[im 21/52]
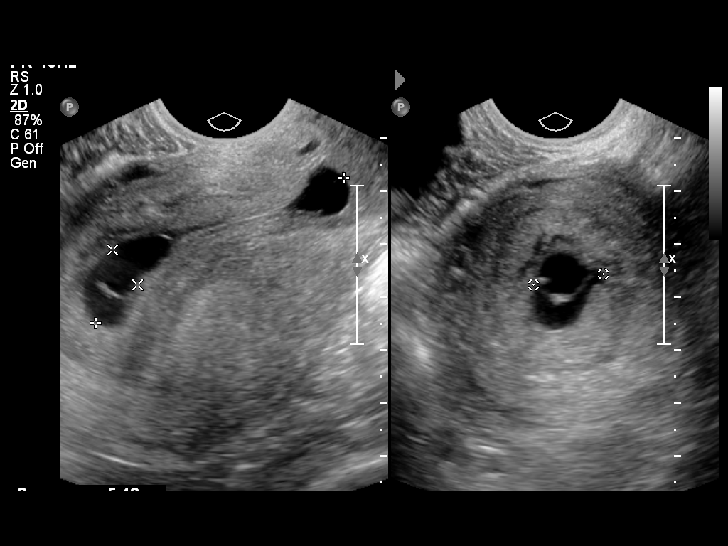
[im 27/52]
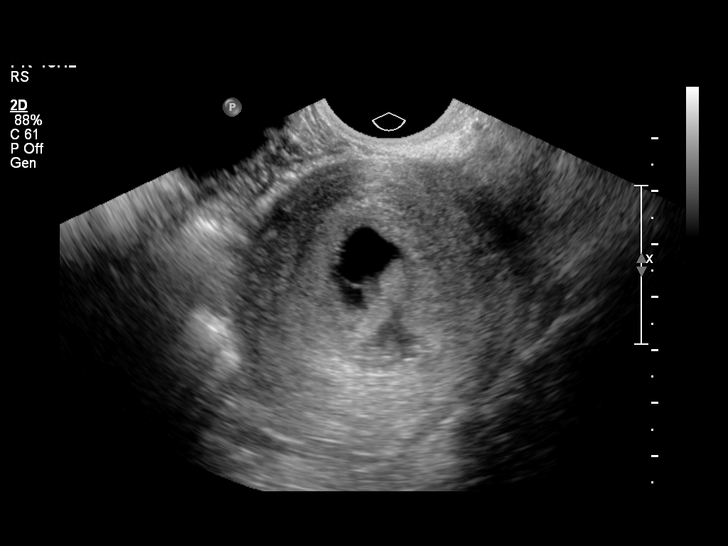
[im 31/52]
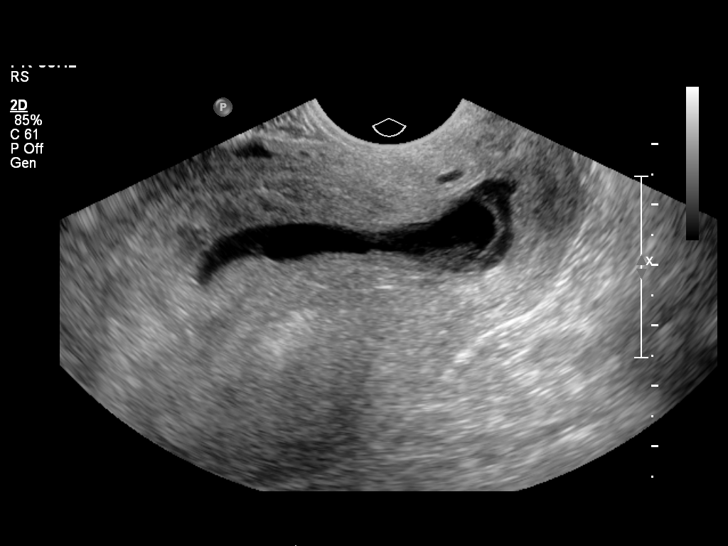
[im 35/52]
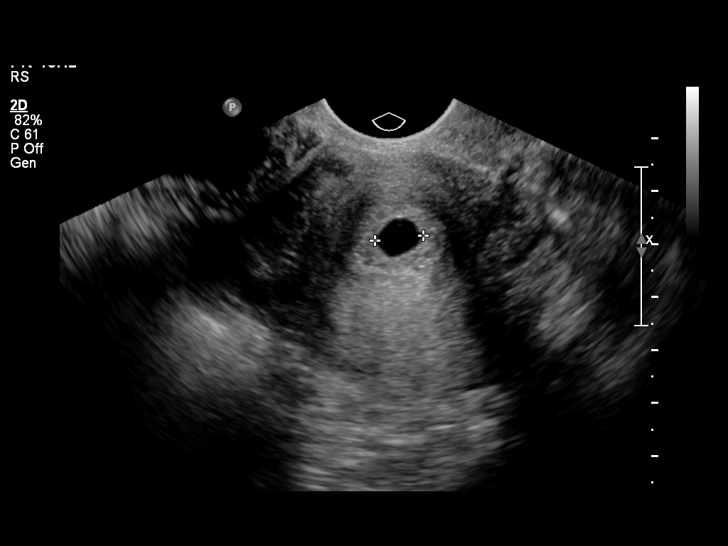
[im 38/52]
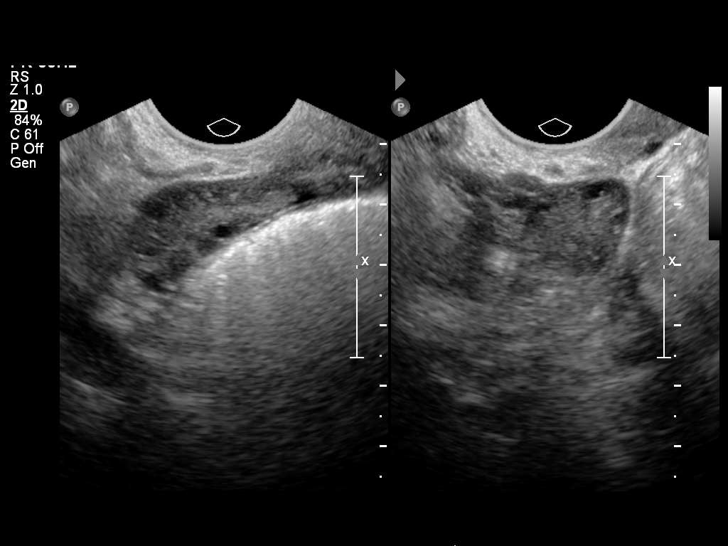
[im 42/52]
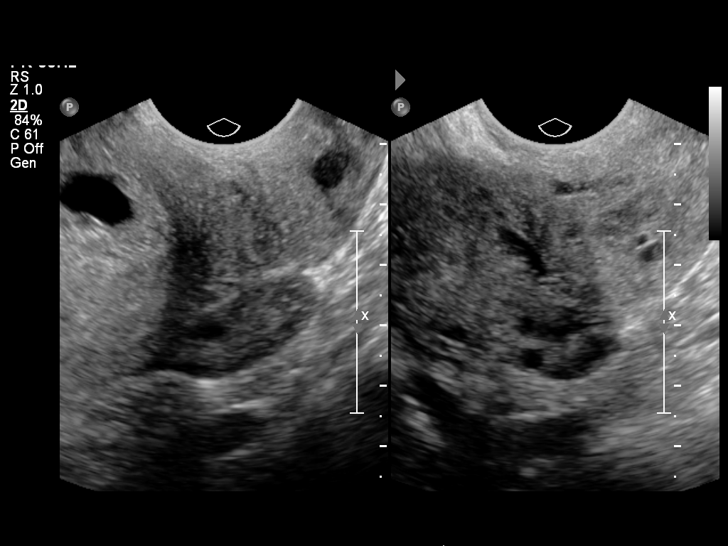
[im 46/52]
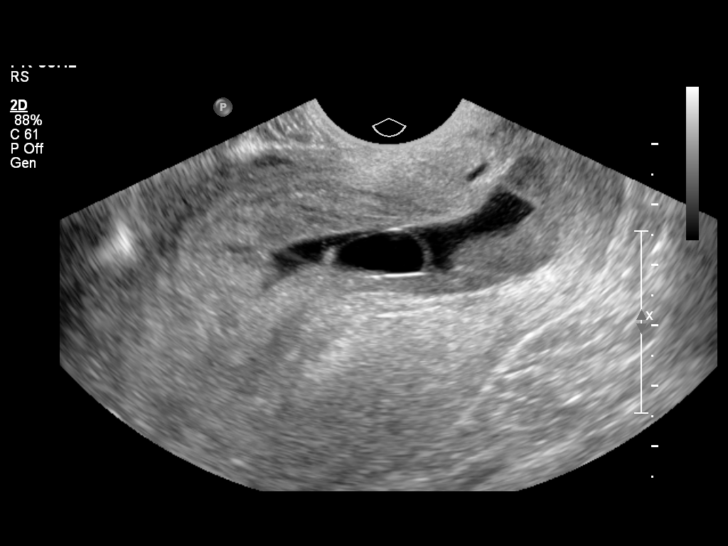
[im 50/52]
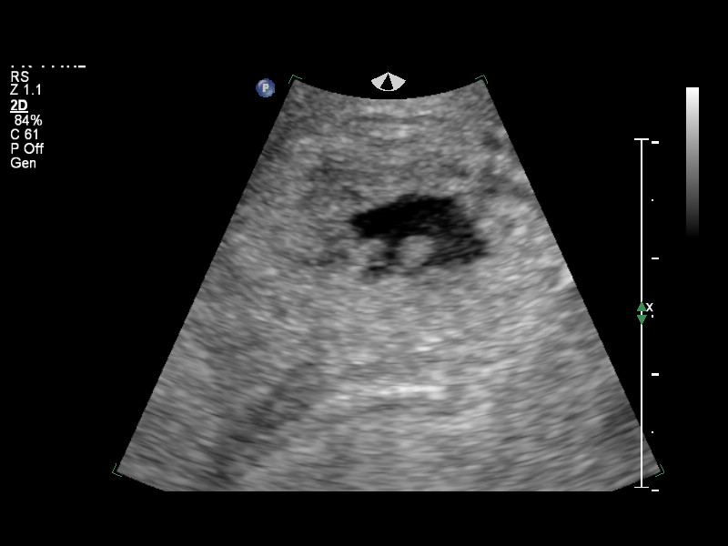

[13 of 28 positions shown; findings below may reference images not displayed]

FINDINGS: Intrauterine gestational sac: Visualized; the gestational sac is
diffusely elongated, and extends to the level of the cervix.

Yolk sac:  Yes

Embryo:  Yes

Cardiac Activity: No

Heart Rate: N/A

CRL:   4.2  mm   6 w 1 d                  US EDC: 03/01/2015

Maternal uterus/adnexae: A moderate amount of subchorionic
hemorrhage is noted.

The ovaries are within normal limits. The right ovary measures 3.0 x
1.5 x 1.7 cm, while the left ovary measures 2.9 x 1.1 x 1.7 cm. No
suspicious adnexal masses are seen; there is no evidence of ovarian
torsion.

No free fluid is seen within the pelvic cul-de-sac.
IMPRESSION: 1. The patient's intrauterine gestational sac is diffusely
elongated, and extends to the level of the cervix. An embryo is
seen, but no cardiac activity is noted at this time. Given the
markedly abnormal appearance of the gestational sac, this likely
reflects spontaneous abortion in progress.
2. Moderate amount of subchorionic hemorrhage noted.

## 2016-05-24 LAB — OB RESULTS CONSOLE HIV ANTIBODY (ROUTINE TESTING): HIV: NONREACTIVE

## 2016-05-24 LAB — OB RESULTS CONSOLE ABO/RH: RH TYPE: POSITIVE

## 2016-05-24 LAB — OB RESULTS CONSOLE GC/CHLAMYDIA
Chlamydia: NEGATIVE
Gonorrhea: NEGATIVE

## 2016-05-24 LAB — OB RESULTS CONSOLE HEPATITIS B SURFACE ANTIGEN: Hepatitis B Surface Ag: NEGATIVE

## 2016-05-24 LAB — OB RESULTS CONSOLE RPR: RPR: NONREACTIVE

## 2016-05-24 LAB — OB RESULTS CONSOLE ANTIBODY SCREEN: ANTIBODY SCREEN: NEGATIVE

## 2016-05-24 LAB — OB RESULTS CONSOLE RUBELLA ANTIBODY, IGM: Rubella: IMMUNE

## 2016-08-04 ENCOUNTER — Encounter (HOSPITAL_COMMUNITY): Payer: Self-pay

## 2016-10-26 ENCOUNTER — Encounter (HOSPITAL_COMMUNITY): Payer: Self-pay | Admitting: *Deleted

## 2016-10-26 ENCOUNTER — Inpatient Hospital Stay (HOSPITAL_COMMUNITY)
Admission: AD | Admit: 2016-10-26 | Discharge: 2016-10-26 | Disposition: A | Discharge status: Home / Self Care | Payer: Medicaid Other | Source: Ambulatory Visit | Attending: Obstetrics and Gynecology | Admitting: Obstetrics and Gynecology

## 2016-10-26 DIAGNOSIS — R1030 Lower abdominal pain, unspecified: Secondary | ICD-10-CM | POA: Diagnosis present

## 2016-10-26 DIAGNOSIS — O4703 False labor before 37 completed weeks of gestation, third trimester: Secondary | ICD-10-CM

## 2016-10-26 DIAGNOSIS — Z3A31 31 weeks gestation of pregnancy: Secondary | ICD-10-CM | POA: Diagnosis not present

## 2016-10-26 HISTORY — DX: Gestational diabetes mellitus in pregnancy, unspecified control: O24.419

## 2016-10-26 HISTORY — DX: Gestational (pregnancy-induced) hypertension without significant proteinuria, unspecified trimester: O13.9

## 2016-10-26 HISTORY — DX: Thyrotoxicosis, unspecified without thyrotoxic crisis or storm: E05.90

## 2016-10-26 LAB — URINALYSIS, ROUTINE W REFLEX MICROSCOPIC
BILIRUBIN URINE: NEGATIVE
GLUCOSE, UA: NEGATIVE mg/dL
KETONES UR: NEGATIVE mg/dL
LEUKOCYTES UA: NEGATIVE
NITRITE: NEGATIVE
PH: 7 (ref 5.0–8.0)
Protein, ur: NEGATIVE mg/dL
SPECIFIC GRAVITY, URINE: 1.006 (ref 1.005–1.030)

## 2016-10-26 MED ORDER — BETAMETHASONE SOD PHOS & ACET 6 (3-3) MG/ML IJ SUSP
12.0000 mg | Freq: Once | INTRAMUSCULAR | Status: AC
  Administered 2016-10-26: 12 mg via INTRAMUSCULAR
  Filled 2016-10-26: qty 2

## 2016-10-26 NOTE — MAU Provider Note (Signed)
History     CSN: [id number]  Arrival date and time: 10/26/16 [id number]   None     Chief Complaint  Patient presents with  . Abdominal Pain  . Injections   HPI Colleen Crawford is a 41 y.o. [id number] at 4037w4d who presents with abdominal pain. Reports intermittent lower abdominal pain since yesterday. States yesterday afternoon her cervix was checked in the office & she was 1 cm. Was told to come to MAU for betamethasone injection; pt was unable to come last night so came this morning. Currently rates pain 1/10 & states it occurs every few hours. Denies vaginal bleeding or LOF. Positive fetal movement.   OB History    Gravida Para Term Preterm AB Living   4 1 1  0 2 1   SAB TAB Ectopic Multiple Live Births   2 0 0 0 1      Past Medical History:  Diagnosis Date  . Gestational diabetes 2013  . Hyperthyroidism   . Pregnancy induced hypertension 2013    Past Surgical History:  Procedure Laterality Date  . APPENDECTOMY    . CESAREAN SECTION    . DILATION AND EVACUATION N/A 07/07/2014   Procedure: DILATATION AND EVACUATION;  Surgeon: Levie HeritageJacob J Stinson, DO;  Location: [facility];  Service: Gynecology;  Laterality: N/A;    Family History  Problem Relation Age of Onset  . Alcohol abuse Neg Hx     Social History  Substance Use Topics  . Smoking status: Never Smoker  . Smokeless tobacco: Never Used  . Alcohol use No    Allergies: No Known Allergies  Prescriptions Prior to Admission  Medication Sig Dispense Refill Last Dose  . Prenatal Vit-Fe Fumarate-FA (PRENATAL MULTIVITAMIN) TABS tablet Take 1 tablet by mouth daily at 12 noon.   10/25/2016 at Unknown time  . ibuprofen (ADVIL,MOTRIN) 600 MG tablet Take 1 tablet (600 mg total) by mouth once. (Patient not taking: Reported on 10/26/2016) 30 tablet 0 Not Taking at Unknown time    Review of Systems  Constitutional: Negative.   Gastrointestinal: Positive for abdominal pain.  Genitourinary: Negative for vaginal bleeding and vaginal  discharge.   Physical Exam   Blood pressure 122/65, pulse 89, temperature 97.9 F (36.6 C), temperature source Oral, resp. rate 16, unknown if currently breastfeeding.  Physical Exam  Nursing note and vitals reviewed. Constitutional: She is oriented to person, place, and time. She appears well-developed and well-nourished. No distress.  HENT:  Head: Normocephalic and atraumatic.  Eyes: Conjunctivae are normal. Right eye exhibits no discharge. Left eye exhibits no discharge. No scleral icterus.  Neck: Normal range of motion.  Respiratory: Effort normal. No respiratory distress.  GI: Soft. There is no tenderness.  Neurological: She is alert and oriented to person, place, and time.  Skin: Skin is warm and dry. She is not diaphoretic.  Psychiatric: She has a normal mood and affect. Her behavior is normal. Judgment and thought content normal.   Dilation: 1 Effacement (%): Thick Cervical Position: Posterior Exam by:: Judeth HornErin Kendre Jacinto NP  Fetal Tracing:  Baseline: 135 Variability: moderate Accelerations: 15x15 Decelerations: none  Toco: x2 in >1 hour MAU Course  Procedures Results for orders placed or performed during the hospital encounter of 10/26/16 (from the past 24 hour(s))  Urinalysis, Routine w reflex microscopic     Status: Abnormal   Collection Time: 10/26/16  8:20 AM  Result Value Ref Range   Color, Urine STRAW (A) YELLOW   APPearance CLEAR CLEAR   Specific Gravity, Urine  1.006 1.005 - 1.030   pH 7.0 5.0 - 8.0   Glucose, UA NEGATIVE NEGATIVE mg/dL   Hgb urine dipstick SMALL (A) NEGATIVE   Bilirubin Urine NEGATIVE NEGATIVE   Ketones, ur NEGATIVE NEGATIVE mg/dL   Protein, ur NEGATIVE NEGATIVE mg/dL   Nitrite NEGATIVE NEGATIVE   Leukocytes, UA NEGATIVE NEGATIVE   RBC / HPF 0-5 0 - 5 RBC/hpf   WBC, UA 0-5 0 - 5 WBC/hpf   Bacteria, UA RARE (A) NONE SEEN   Squamous Epithelial / LPF 0-5 (A) NONE SEEN   Mucous PRESENT     MDM Reactive fetal tracing -- no regular ctx  on toco SVE unchanged from yesterday BMZ #1 given S/w Dr. Mindi Slicker. Ok to discharge home for pt to return tomorrow morning for next injection  Assessment and Plan  A; 1. Preterm uterine contractions in third trimester, antepartum    P: Discharge home Preterm labor precautions Return to MAU tomorrow morning for BMZ #2 Keep f/u with OB  Judeth Horn 10/26/2016, 9:52 AM

## 2016-10-26 NOTE — Discharge Instructions (Signed)
Preterm Labor and Birth Information °Pregnancy normally lasts 39-41 weeks. Preterm labor is when labor starts early. It starts before you have been pregnant for 37 whole weeks. °What are the risk factors for preterm labor? °Preterm labor is more likely to occur in women who: °· Have an infection while pregnant. °· Have a cervix that is short. °· Have gone into preterm labor before. °· Have had surgery on their cervix. °· Are younger than age 40. °· Are older than age 35. °· Are African American. °· Are pregnant with two or more babies. °· Take street drugs while pregnant. °· Smoke while pregnant. °· Do not gain enough weight while pregnant. °· Got pregnant right after another pregnancy. °What are the symptoms of preterm labor? °Symptoms of preterm labor include: °· Cramps. The cramps may feel like the cramps some women get during their period. The cramps may happen with watery poop (diarrhea). °· Pain in the belly (abdomen). °· Pain in the lower back. °· Regular contractions or tightening. It may feel like your belly is getting tighter. °· Pressure in the lower belly that seems to get stronger. °· More fluid (discharge) leaking from the vagina. The fluid may be watery or bloody. °· Water breaking. °Why is it important to notice signs of preterm labor? °Babies who are born early may not be fully developed. They have a higher chance for: °· Long-term heart problems. °· Long-term lung problems. °· Trouble controlling body systems, like breathing. °· Bleeding in the brain. °· A condition called cerebral palsy. °· Learning difficulties. °· Death. °These risks are highest for babies who are born before 34 weeks of pregnancy. °How is preterm labor treated? °Treatment depends on: °· How long you were pregnant. °· Your condition. °· The health of your baby. °Treatment may involve: °· Having a stitch (suture) placed in your cervix. When you give birth, your cervix opens so the baby can come out. The stitch keeps the cervix  from opening too soon. °· Staying at the hospital. °· Taking or getting medicines, such as: °¨ Hormone medicines. °¨ Medicines to stop contractions. °¨ Medicines to help the baby’s lungs develop. °¨ Medicines to prevent your baby from having cerebral palsy. °What should I do if I am in preterm labor? °If you think you are going into labor too soon, call your doctor right away. °How can I prevent preterm labor? °· Do not use any tobacco products. °¨ Examples of these are cigarettes, chewing tobacco, and e-cigarettes. °¨ If you need help quitting, ask your doctor. °· Do not use street drugs. °· Do not use any medicines unless you ask your doctor if they are safe for you. °· Talk with your doctor before taking any herbal supplements. °· Make sure you gain enough weight. °· Watch for infection. If you think you might have an infection, get it checked right away. °· If you have gone into preterm labor before, tell your doctor. °This information is not intended to replace advice given to you by your health care provider. Make sure you discuss any questions you have with your health care provider. °Document Released: 12/10/2008 Document Revised: 02/24/2016 Document Reviewed: 02/04/2016 °Elsevier Interactive Patient Education © 2017 Elsevier Inc. ° °

## 2016-10-26 NOTE — MAU Note (Signed)
Pt had SVE yesterday by Dr Ellyn HackBovard and has been cramping ever since; dilated 1 cm yesterday;

## 2016-10-26 NOTE — MAU Note (Signed)
C/o constant cramping cervical pain since yesterday afternoon; denies any vaginal bleeding or SROM;

## 2016-10-27 ENCOUNTER — Inpatient Hospital Stay (HOSPITAL_COMMUNITY)
Admission: AD | Admit: 2016-10-27 | Discharge: 2016-10-27 | Disposition: A | Discharge status: Home / Self Care | Payer: Medicaid Other | Source: Ambulatory Visit | Attending: Obstetrics and Gynecology | Admitting: Obstetrics and Gynecology

## 2016-10-27 DIAGNOSIS — Z3A32 32 weeks gestation of pregnancy: Secondary | ICD-10-CM | POA: Insufficient documentation

## 2016-10-27 DIAGNOSIS — O4703 False labor before 37 completed weeks of gestation, third trimester: Secondary | ICD-10-CM

## 2016-10-27 MED ORDER — BETAMETHASONE SOD PHOS & ACET 6 (3-3) MG/ML IJ SUSP
12.0000 mg | Freq: Once | INTRAMUSCULAR | Status: AC
  Administered 2016-10-27: 12 mg via INTRAMUSCULAR
  Filled 2016-10-27: qty 2

## 2016-10-27 NOTE — MAU Note (Signed)
Here for 2nd dose

## 2016-10-27 NOTE — MAU Note (Signed)
No complaints offered.  States did ok with injection yesterday.  Is feeling better today

## 2016-11-23 ENCOUNTER — Telehealth (HOSPITAL_COMMUNITY): Payer: Self-pay | Admitting: *Deleted

## 2016-11-23 NOTE — Telephone Encounter (Signed)
Preadmission screen  

## 2016-11-24 ENCOUNTER — Telehealth (HOSPITAL_COMMUNITY): Payer: Self-pay | Admitting: *Deleted

## 2016-11-24 NOTE — Telephone Encounter (Signed)
Preadmission screen  

## 2016-11-26 ENCOUNTER — Encounter (HOSPITAL_COMMUNITY): Payer: Self-pay

## 2016-12-02 ENCOUNTER — Encounter (HOSPITAL_COMMUNITY): Payer: Self-pay | Admitting: Obstetrics and Gynecology

## 2016-12-02 ENCOUNTER — Encounter (HOSPITAL_COMMUNITY)
Admission: RE | Admit: 2016-12-02 | Discharge: 2016-12-02 | Disposition: A | Discharge status: Home / Self Care | Payer: Medicaid Other | Source: Ambulatory Visit | Attending: Obstetrics and Gynecology | Admitting: Obstetrics and Gynecology

## 2016-12-02 DIAGNOSIS — O24420 Gestational diabetes mellitus in childbirth, diet controlled: Secondary | ICD-10-CM

## 2016-12-02 DIAGNOSIS — O24313 Unspecified pre-existing diabetes mellitus in pregnancy, third trimester: Secondary | ICD-10-CM | POA: Diagnosis present

## 2016-12-02 HISTORY — DX: Gestational diabetes mellitus in childbirth, diet controlled: O24.420

## 2016-12-02 HISTORY — DX: Unspecified pre-existing diabetes mellitus in pregnancy, third trimester: O24.313

## 2016-12-02 LAB — COMPREHENSIVE METABOLIC PANEL
ALBUMIN: 2.8 g/dL — AB (ref 3.5–5.0)
ALK PHOS: 83 U/L (ref 38–126)
ALT: 20 U/L (ref 14–54)
ANION GAP: 8 (ref 5–15)
AST: 24 U/L (ref 15–41)
BILIRUBIN TOTAL: 0.5 mg/dL (ref 0.3–1.2)
BUN: 9 mg/dL (ref 6–20)
CO2: 23 mmol/L (ref 22–32)
Calcium: 9 mg/dL (ref 8.9–10.3)
Chloride: 104 mmol/L (ref 101–111)
Creatinine, Ser: 0.68 mg/dL (ref 0.44–1.00)
GFR calc Af Amer: >60 mL/min (ref >60)
GFR calc non Af Amer: >60 mL/min (ref >60)
GLUCOSE: 121 mg/dL — AB (ref 65–99)
POTASSIUM: 3.5 mmol/L (ref 3.5–5.1)
SODIUM: 135 mmol/L (ref 135–145)
Total Protein: 7 g/dL (ref 6.5–8.1)

## 2016-12-02 LAB — TYPE AND SCREEN
ABO/RH(D): A POS
Antibody Screen: NEGATIVE

## 2016-12-02 LAB — CBC
HEMATOCRIT: 35.2 % — AB (ref 36.0–46.0)
HEMOGLOBIN: 11.7 g/dL — AB (ref 12.0–15.0)
MCH: 28.2 pg (ref 26.0–34.0)
MCHC: 33.2 g/dL (ref 30.0–36.0)
MCV: 84.8 fL (ref 78.0–100.0)
Platelets: 241 10*3/uL (ref 150–400)
RBC: 4.15 MIL/uL (ref 3.87–5.11)
RDW: 14.6 % (ref 11.5–15.5)
WBC: 14.4 10*3/uL — ABNORMAL HIGH (ref 4.0–10.5)

## 2016-12-02 NOTE — H&P (Signed)
Colleen Crawford is a 41 y.o. female [phone] at 52+ with PreE, AMA and GDM for rLTCS.Pregnancy complicated by AMA - nl panorama.  PreE - proteinuria 1000+mg, CBC and CMP followed normal, BP mildly elevated.  GDM - diet controlled.  Received Tdap and Flu 11/02/16.  Short cervical length also noted.    OB History    Gravida Para Term Preterm AB Living   4 1 1  0 2 1   SAB TAB Ectopic Multiple Live Births   2 0 0 0 1    G1 8/13 LTCS - CPD - GDM, PreE; female 7#5 G2 and 3 early SAB G4 present - GDM, PreE  No abn pap No STD  Past Medical History:  Diagnosis Date  . Gestational diabetes 2013 and 2018  . Gestational diabetes mellitus (GDM) in childbirth, diet controlled 12/02/2016  . Hyperthyroidism   . Preexisting diabetes complicating pregnancy in third trimester, antepartum 12/02/2016  . Pregnancy induced hypertension 2013   Past Surgical History:  Procedure Laterality Date  . APPENDECTOMY    . CESAREAN SECTION    . DILATION AND EVACUATION N/A 07/07/2014   Procedure: DILATATION AND EVACUATION;  Surgeon: Levie Heritage, DO;  Location: [facility];  Service: Gynecology;  Laterality: N/A;  appendix - ruptured - midline vertical incision  Family History: family history includes Asthma in her sister; Diabetes in her maternal grandfather. HTN Social History:  reports that she has never smoked. She has never used smokeless tobacco. She reports that she does not drink alcohol or use drugs. married, SAHM  Meds PNV All NKDA     Maternal Diabetes: Yes:  Diabetes Type:  Diet controlled Genetic Screening: Normal Maternal Ultrasounds/Referrals: Normal Fetal Ultrasounds or other Referrals:  None Maternal Substance Abuse:  No Significant Maternal Medications:  None Significant Maternal Lab Results:  Lab values include: Group B Strep positive Other Comments:  Tdap 11/02/16, Flu 11/02/16, nl hgb electro, neg CF, SMA and Fragile X   Dated by LMP c/w first trimester [place] Nl NT Panorama low-risk, female [place] - nl  anat, post plac, female  Hgb 12.8/Plt 351/Ur Cx neg/GC neg/Chl neg/Varicella immune/glucola 198/nl CBC and CMP Essential panel negative  Review of Systems  Constitutional: Negative.   HENT: Negative.   Eyes: Negative.   Respiratory: Negative.   Cardiovascular: Negative.   Gastrointestinal: Negative.   Genitourinary: Negative.   Musculoskeletal: Positive for back pain.  Skin: Negative.   Neurological: Negative.   Psychiatric/Behavioral: Negative.    Maternal Medical History:  Contractions: Frequency: irregular.   Perceived severity is moderate.    Fetal activity: Perceived fetal activity is normal.    Prenatal complications: Pre-eclampsia.   AMA  Prenatal Complications - Diabetes: gestational. Diabetes is managed by diet.        unknown if currently breastfeeding. Maternal Exam:  Uterine Assessment: Contraction frequency is irregular.   Abdomen: Patient reports no abdominal tenderness. Surgical scars: low transverse, low vertical midline and appendectomy scar.   Fundal height is appropriate for gestation.   Estimated fetal weight is 7-8#.   Fetal presentation: vertex  Introitus: Normal vulva. Normal vagina.    Physical Exam  Constitutional: She is oriented to person, place, and time. She appears well-developed and well-nourished.  HENT:  Head: Normocephalic and atraumatic.  Cardiovascular: Normal rate and regular rhythm.   Respiratory: Breath sounds normal. No respiratory distress. She has no wheezes.  GI: Soft. Bowel sounds are normal. She exhibits no distension. There is no tenderness.  extensive surgical scars  Musculoskeletal:  Normal range of motion.  Neurological: She is alert and oriented to person, place, and time.  Skin: Skin is warm and dry.  Psychiatric: She has a normal mood and affect. Her behavior is normal.    Prenatal labs: ABO, Rh: --/--/A POS (03/08 1200) Antibody: NEG (03/08 1200) Rubella: Immune (08/28 0000) RPR: Nonreactive (08/28 0000)   HBsAg: Negative (08/28 0000)  HIV: Non-reactive (08/28 0000)  GBS:   positive  Hgb12.8/Plt 351/Ur Cx neg/ GC neg/Chl neg/Varicella immune/essential panel neg  Dated by LMP cw first trimester US Nl NT Nl anat, post plac, female  Panorama low risk female  glucola 198   Assessment/Plan: 40yo [id number] at 7437 for rLTCS with PreE and GDM Also midline vertical incision from appendectomy, also h/o LTCS Ancef for prophylaxis D/w pt r/b/a of rLTCS   Bovard-Stuckert, Trishna Cwik 12/02/2016, 4:05 PM

## 2016-12-02 NOTE — Patient Instructions (Signed)
20 Earlean PolkaSaida Rodgers  12/02/2016   Your procedure is scheduled on:  12/03/2016  [facility] through the [facility] Entrance of [facility] at 1030 AM.  Pick up the phone at the desk and dial [phone].   Call this number if you have problems the morning of surgery: [phone]   Remember:   Do not eat food:After Midnight.  Do not drink clear liquids: After Midnight.  Take these medicines the morning of surgery with A SIP OF WATER: none   Do not wear jewelry, make-up or nail polish.  Do not wear lotions, powders, or perfumes. Do not wear deodorant.  Do not shave 48 hours prior to surgery.  Do not bring valuables to the hospital.  [facility] is not   responsible for any belongings or valuables brought to the hospital.  Contacts, dentures or bridgework may not be worn into surgery.  Leave suitcase in the car. After surgery it may be brought to your room.  For patients admitted to the hospital, checkout time is 11:00 AM the day of              discharge.   Patients discharged the day of surgery will not be allowed to drive             home.  Name and phone number of your driver: na  Special Instructions:   N/A   Please read over the following fact sheets that you were given:   Surgical Site Infection Prevention

## 2016-12-03 ENCOUNTER — Inpatient Hospital Stay (HOSPITAL_COMMUNITY): Payer: Medicaid Other | Admitting: Anesthesiology

## 2016-12-03 ENCOUNTER — Encounter (HOSPITAL_COMMUNITY): Payer: Self-pay | Admitting: Pediatrics

## 2016-12-03 ENCOUNTER — Inpatient Hospital Stay (HOSPITAL_COMMUNITY)
Admission: AD | Admit: 2016-12-03 | Discharge: 2016-12-05 | DRG: 766 | Disposition: A | Discharge status: Home / Self Care | Payer: Medicaid Other | Source: Ambulatory Visit | Attending: Obstetrics and Gynecology | Admitting: Obstetrics and Gynecology

## 2016-12-03 ENCOUNTER — Encounter (HOSPITAL_COMMUNITY)
Admission: AD | Disposition: A | Discharge status: Home / Self Care | Payer: Self-pay | Source: Ambulatory Visit | Attending: Obstetrics and Gynecology

## 2016-12-03 DIAGNOSIS — O24420 Gestational diabetes mellitus in childbirth, diet controlled: Secondary | ICD-10-CM | POA: Diagnosis present

## 2016-12-03 DIAGNOSIS — O1494 Unspecified pre-eclampsia, complicating childbirth: Secondary | ICD-10-CM | POA: Diagnosis present

## 2016-12-03 DIAGNOSIS — O34211 Maternal care for low transverse scar from previous cesarean delivery: Secondary | ICD-10-CM | POA: Diagnosis present

## 2016-12-03 DIAGNOSIS — Z3A37 37 weeks gestation of pregnancy: Secondary | ICD-10-CM | POA: Diagnosis not present

## 2016-12-03 DIAGNOSIS — O99824 Streptococcus B carrier state complicating childbirth: Secondary | ICD-10-CM | POA: Diagnosis present

## 2016-12-03 DIAGNOSIS — Z98891 History of uterine scar from previous surgery: Secondary | ICD-10-CM

## 2016-12-03 DIAGNOSIS — O24313 Unspecified pre-existing diabetes mellitus in pregnancy, third trimester: Secondary | ICD-10-CM | POA: Diagnosis present

## 2016-12-03 HISTORY — DX: History of uterine scar from previous surgery: Z98.891

## 2016-12-03 HISTORY — DX: Gestational diabetes mellitus in childbirth, diet controlled: O24.420

## 2016-12-03 HISTORY — DX: Unspecified pre-existing diabetes mellitus in pregnancy, third trimester: O24.313

## 2016-12-03 LAB — GLUCOSE, CAPILLARY: GLUCOSE-CAPILLARY: 79 mg/dL (ref 65–99)

## 2016-12-03 LAB — RPR: RPR Ser Ql: NONREACTIVE

## 2016-12-03 SURGERY — Surgical Case
Anesthesia: Spinal | Wound class: Clean Contaminated

## 2016-12-03 MED ORDER — MEPERIDINE HCL 25 MG/ML IJ SOLN: 6.2500 mg | INTRAMUSCULAR | Status: DC | PRN

## 2016-12-03 MED ORDER — COCONUT OIL OIL
1.0000 "application " | TOPICAL_OIL | Status: DC | PRN
  Administered 2016-12-04: 1 via TOPICAL
  Filled 2016-12-03: qty 120

## 2016-12-03 MED ORDER — ONDANSETRON HCL 4 MG/2ML IJ SOLN
INTRAMUSCULAR | Status: DC | PRN
Start: 2016-12-03 — End: 2016-12-03
  Administered 2016-12-03: 4 mg via INTRAVENOUS

## 2016-12-03 MED ORDER — SCOPOLAMINE 1 MG/3DAYS TD PT72
MEDICATED_PATCH | TRANSDERMAL | Status: AC
  Filled 2016-12-03: qty 1

## 2016-12-03 MED ORDER — PHENYLEPHRINE 40 MCG/ML (10ML) SYRINGE FOR IV PUSH (FOR BLOOD PRESSURE SUPPORT)
PREFILLED_SYRINGE | INTRAVENOUS | Status: AC
  Filled 2016-12-03: qty 10

## 2016-12-03 MED ORDER — MORPHINE SULFATE (PF) 0.5 MG/ML IJ SOLN
INTRAMUSCULAR | Status: DC | PRN
  Administered 2016-12-03: .2 mg via INTRATHECAL

## 2016-12-03 MED ORDER — OXYTOCIN 10 UNIT/ML IJ SOLN
INTRAMUSCULAR | Status: AC
  Filled 2016-12-03: qty 4

## 2016-12-03 MED ORDER — ONDANSETRON HCL 4 MG/2ML IJ SOLN
INTRAMUSCULAR | Status: AC
  Filled 2016-12-03: qty 2

## 2016-12-03 MED ORDER — BUPIVACAINE IN DEXTROSE 0.75-8.25 % IT SOLN
INTRATHECAL | Status: DC | PRN
  Administered 2016-12-03: 1.4 mL via INTRATHECAL

## 2016-12-03 MED ORDER — PROMETHAZINE HCL 25 MG/ML IJ SOLN: 6.2500 mg | INTRAMUSCULAR | Status: DC | PRN

## 2016-12-03 MED ORDER — FENTANYL CITRATE (PF) 100 MCG/2ML IJ SOLN
INTRAMUSCULAR | Status: DC | PRN
  Administered 2016-12-03: 20 ug via INTRATHECAL

## 2016-12-03 MED ORDER — ACETAMINOPHEN 325 MG PO TABS: 650.0000 mg | ORAL_TABLET | ORAL | Status: DC | PRN

## 2016-12-03 MED ORDER — OXYCODONE HCL 5 MG PO TABS: 5.0000 mg | ORAL_TABLET | ORAL | Status: DC | PRN

## 2016-12-03 MED ORDER — PHENYLEPHRINE 8 MG IN D5W 100 ML (0.08MG/ML) PREMIX OPTIME
INJECTION | INTRAVENOUS | Status: AC
  Filled 2016-12-03: qty 100

## 2016-12-03 MED ORDER — HYDROMORPHONE HCL 1 MG/ML IJ SOLN: 0.2500 mg | INTRAMUSCULAR | Status: DC | PRN

## 2016-12-03 MED ORDER — NALBUPHINE HCL 10 MG/ML IJ SOLN: 5.0000 mg | INTRAMUSCULAR | Status: DC | PRN

## 2016-12-03 MED ORDER — KETOROLAC TROMETHAMINE 30 MG/ML IJ SOLN
30.0000 mg | Freq: Four times a day (QID) | INTRAMUSCULAR | Status: AC | PRN
  Administered 2016-12-03: 30 mg via INTRAMUSCULAR

## 2016-12-03 MED ORDER — KETOROLAC TROMETHAMINE 30 MG/ML IJ SOLN
INTRAMUSCULAR | Status: AC
  Filled 2016-12-03: qty 1

## 2016-12-03 MED ORDER — MORPHINE SULFATE (PF) 0.5 MG/ML IJ SOLN
INTRAMUSCULAR | Status: AC
  Filled 2016-12-03: qty 10

## 2016-12-03 MED ORDER — IBUPROFEN 600 MG PO TABS: 600.0000 mg | ORAL_TABLET | Freq: Four times a day (QID) | ORAL | Status: DC | PRN

## 2016-12-03 MED ORDER — NALOXONE HCL 2 MG/2ML IJ SOSY
1.0000 ug/kg/h | PREFILLED_SYRINGE | INTRAVENOUS | Status: DC | PRN
  Filled 2016-12-03: qty 2

## 2016-12-03 MED ORDER — KETOROLAC TROMETHAMINE 30 MG/ML IJ SOLN: 30.0000 mg | Freq: Once | INTRAMUSCULAR | Status: DC

## 2016-12-03 MED ORDER — PHENYLEPHRINE 8 MG IN D5W 100 ML (0.08MG/ML) PREMIX OPTIME
INJECTION | INTRAVENOUS | Status: DC | PRN
  Administered 2016-12-03: 30 ug/min via INTRAVENOUS

## 2016-12-03 MED ORDER — SCOPOLAMINE 1 MG/3DAYS TD PT72
MEDICATED_PATCH | TRANSDERMAL | Status: DC | PRN
  Administered 2016-12-03: 1 via TRANSDERMAL

## 2016-12-03 MED ORDER — DIPHENHYDRAMINE HCL 50 MG/ML IJ SOLN: 12.5000 mg | INTRAMUSCULAR | Status: DC | PRN

## 2016-12-03 MED ORDER — NALBUPHINE HCL 10 MG/ML IJ SOLN: 5.0000 mg | Freq: Once | INTRAMUSCULAR | Status: DC | PRN

## 2016-12-03 MED ORDER — DIPHENHYDRAMINE HCL 25 MG PO CAPS: 25.0000 mg | ORAL_CAPSULE | ORAL | Status: DC | PRN

## 2016-12-03 MED ORDER — FENTANYL CITRATE (PF) 100 MCG/2ML IJ SOLN
INTRAMUSCULAR | Status: AC
  Filled 2016-12-03: qty 2

## 2016-12-03 MED ORDER — OXYCODONE HCL 5 MG PO TABS
10.0000 mg | ORAL_TABLET | ORAL | Status: DC | PRN
Start: 2016-12-03 — End: 2016-12-05

## 2016-12-03 MED ORDER — KETOROLAC TROMETHAMINE 30 MG/ML IJ SOLN: 30.0000 mg | Freq: Four times a day (QID) | INTRAMUSCULAR | Status: AC | PRN

## 2016-12-03 MED ORDER — SIMETHICONE 80 MG PO CHEW
80.0000 mg | CHEWABLE_TABLET | Freq: Three times a day (TID) | ORAL | Status: DC
  Administered 2016-12-04 – 2016-12-05 (×4): 80 mg via ORAL
  Filled 2016-12-03 (×4): qty 1

## 2016-12-03 MED ORDER — OXYTOCIN 40 UNITS IN LACTATED RINGERS INFUSION - SIMPLE MED: 2.5000 [IU]/h | INTRAVENOUS | Status: AC

## 2016-12-03 MED ORDER — NALOXONE HCL 0.4 MG/ML IJ SOLN: 0.4000 mg | INTRAMUSCULAR | Status: DC | PRN

## 2016-12-03 MED ORDER — DEXAMETHASONE SODIUM PHOSPHATE 4 MG/ML IJ SOLN
INTRAMUSCULAR | Status: AC
  Filled 2016-12-03: qty 1

## 2016-12-03 MED ORDER — DIBUCAINE 1 % RE OINT: 1.0000 "application " | TOPICAL_OINTMENT | RECTAL | Status: DC | PRN

## 2016-12-03 MED ORDER — LACTATED RINGERS IV SOLN
INTRAVENOUS | Status: DC
  Administered 2016-12-03: 12:00:00 via INTRAVENOUS

## 2016-12-03 MED ORDER — ONDANSETRON HCL 4 MG/2ML IJ SOLN: 4.0000 mg | Freq: Three times a day (TID) | INTRAMUSCULAR | Status: DC | PRN

## 2016-12-03 MED ORDER — WITCH HAZEL-GLYCERIN EX PADS: 1.0000 "application " | MEDICATED_PAD | CUTANEOUS | Status: DC | PRN

## 2016-12-03 MED ORDER — SODIUM CHLORIDE 0.9 % IR SOLN
Status: DC | PRN
  Administered 2016-12-03: 1

## 2016-12-03 MED ORDER — MENTHOL 3 MG MT LOZG: 1.0000 | LOZENGE | OROMUCOSAL | Status: DC | PRN

## 2016-12-03 MED ORDER — SENNOSIDES-DOCUSATE SODIUM 8.6-50 MG PO TABS
2.0000 | ORAL_TABLET | ORAL | Status: DC
  Administered 2016-12-03 – 2016-12-04 (×2): 2 via ORAL
  Filled 2016-12-03 (×2): qty 2

## 2016-12-03 MED ORDER — PHENYLEPHRINE HCL 10 MG/ML IJ SOLN
INTRAMUSCULAR | Status: DC | PRN
  Administered 2016-12-03 (×2): 40 ug via INTRAVENOUS

## 2016-12-03 MED ORDER — METOCLOPRAMIDE HCL 5 MG/ML IJ SOLN
10.0000 mg | Freq: Four times a day (QID) | INTRAMUSCULAR | Status: DC | PRN
  Administered 2016-12-03: 10 mg via INTRAVENOUS
  Filled 2016-12-03: qty 2

## 2016-12-03 MED ORDER — ACETAMINOPHEN 500 MG PO TABS
1000.0000 mg | ORAL_TABLET | Freq: Four times a day (QID) | ORAL | Status: AC
  Administered 2016-12-03 – 2016-12-04 (×3): 1000 mg via ORAL
  Filled 2016-12-03 (×3): qty 2

## 2016-12-03 MED ORDER — ZOLPIDEM TARTRATE 5 MG PO TABS: 5.0000 mg | ORAL_TABLET | Freq: Every evening | ORAL | Status: DC | PRN

## 2016-12-03 MED ORDER — PRENATAL MULTIVITAMIN CH
1.0000 | ORAL_TABLET | Freq: Every day | ORAL | Status: DC
  Administered 2016-12-04: 1 via ORAL
  Filled 2016-12-03: qty 1

## 2016-12-03 MED ORDER — SCOPOLAMINE 1 MG/3DAYS TD PT72: 1.0000 | MEDICATED_PATCH | Freq: Once | TRANSDERMAL | Status: DC

## 2016-12-03 MED ORDER — OXYTOCIN 10 UNIT/ML IJ SOLN
INTRAMUSCULAR | Status: DC | PRN
  Administered 2016-12-03: 40 [IU] via INTRAVENOUS

## 2016-12-03 MED ORDER — ONDANSETRON HCL 4 MG/2ML IJ SOLN
INTRAMUSCULAR | Status: AC
Start: 2016-12-03 — End: 2016-12-03
  Filled 2016-12-03: qty 2

## 2016-12-03 MED ORDER — SODIUM CHLORIDE 0.9% FLUSH: 3.0000 mL | INTRAVENOUS | Status: DC | PRN

## 2016-12-03 MED ORDER — KETOROLAC TROMETHAMINE 30 MG/ML IJ SOLN: 30.0000 mg | Freq: Four times a day (QID) | INTRAMUSCULAR | Status: DC | PRN

## 2016-12-03 MED ORDER — LACTATED RINGERS IV SOLN
INTRAVENOUS | Status: DC
  Administered 2016-12-04: 02:00:00 via INTRAVENOUS

## 2016-12-03 MED ORDER — SIMETHICONE 80 MG PO CHEW: 80.0000 mg | CHEWABLE_TABLET | ORAL | Status: DC | PRN

## 2016-12-03 MED ORDER — CEFAZOLIN SODIUM-DEXTROSE 2-4 GM/100ML-% IV SOLN
2.0000 g | INTRAVENOUS | Status: AC
  Administered 2016-12-03: 2 g via INTRAVENOUS

## 2016-12-03 MED ORDER — DIPHENHYDRAMINE HCL 25 MG PO CAPS: 25.0000 mg | ORAL_CAPSULE | Freq: Four times a day (QID) | ORAL | Status: DC | PRN

## 2016-12-03 MED ORDER — SIMETHICONE 80 MG PO CHEW
80.0000 mg | CHEWABLE_TABLET | ORAL | Status: DC
  Administered 2016-12-03 – 2016-12-04 (×2): 80 mg via ORAL
  Filled 2016-12-03 (×2): qty 1

## 2016-12-03 MED ORDER — IBUPROFEN 800 MG PO TABS
800.0000 mg | ORAL_TABLET | Freq: Three times a day (TID) | ORAL | Status: DC
  Administered 2016-12-04 – 2016-12-05 (×3): 800 mg via ORAL
  Filled 2016-12-03 (×3): qty 1

## 2016-12-03 SURGICAL SUPPLY — 38 items
BENZOIN TINCTURE PRP APPL 2/3 (GAUZE/BANDAGES/DRESSINGS) ×3 IMPLANT
CHLORAPREP W/TINT 26ML (MISCELLANEOUS) ×3 IMPLANT
CLAMP CORD UMBIL (MISCELLANEOUS) IMPLANT
CLOSURE STERI STRIP 1/2 X4 (GAUZE/BANDAGES/DRESSINGS) ×2 IMPLANT
CLOSURE WOUND 1/2 X4 (GAUZE/BANDAGES/DRESSINGS) ×1
CLOTH BEACON ORANGE TIMEOUT ST (SAFETY) ×3 IMPLANT
CONTAINER PREFILL 10% NBF 15ML (MISCELLANEOUS) IMPLANT
DRSG OPSITE POSTOP 4X10 (GAUZE/BANDAGES/DRESSINGS) ×3 IMPLANT
DRSG OPSITE POSTOP 4X12 (GAUZE/BANDAGES/DRESSINGS) ×3 IMPLANT
ELECT REM PT RETURN 9FT ADLT (ELECTROSURGICAL) ×3
ELECTRODE REM PT RTRN 9FT ADLT (ELECTROSURGICAL) ×1 IMPLANT
EXTRACTOR VACUUM M CUP 4 TUBE (SUCTIONS) IMPLANT
EXTRACTOR VACUUM M CUP 4' TUBE (SUCTIONS)
GLOVE BIO SURGEON STRL SZ 6.5 (GLOVE) ×2 IMPLANT
GLOVE BIO SURGEONS STRL SZ 6.5 (GLOVE) ×1
GLOVE BIOGEL PI IND STRL 7.0 (GLOVE) ×1 IMPLANT
GLOVE BIOGEL PI INDICATOR 7.0 (GLOVE) ×2
GOWN STRL REUS W/TWL LRG LVL3 (GOWN DISPOSABLE) ×6 IMPLANT
KIT ABG SYR 3ML LUER SLIP (SYRINGE) IMPLANT
NEEDLE HYPO 25X5/8 SAFETYGLIDE (NEEDLE) IMPLANT
NS IRRIG 1000ML POUR BTL (IV SOLUTION) ×3 IMPLANT
PACK C SECTION WH (CUSTOM PROCEDURE TRAY) ×3 IMPLANT
PAD OB MATERNITY 4.3X12.25 (PERSONAL CARE ITEMS) ×3 IMPLANT
PENCIL SMOKE EVAC W/HOLSTER (ELECTROSURGICAL) ×3 IMPLANT
RTRCTR C-SECT PINK 25CM LRG (MISCELLANEOUS) ×3 IMPLANT
STRIP CLOSURE SKIN 1/2X4 (GAUZE/BANDAGES/DRESSINGS) ×2 IMPLANT
SUT MNCRL 0 VIOLET CTX 36 (SUTURE) ×2 IMPLANT
SUT MONOCRYL 0 CTX 36 (SUTURE) ×4
SUT PLAIN 1 NONE 54 (SUTURE) IMPLANT
SUT PLAIN 2 0 XLH (SUTURE) ×3 IMPLANT
SUT VIC AB 0 CT1 27 (SUTURE) ×4
SUT VIC AB 0 CT1 27XBRD ANBCTR (SUTURE) ×2 IMPLANT
SUT VIC AB 2-0 CT1 27 (SUTURE) ×2
SUT VIC AB 2-0 CT1 TAPERPNT 27 (SUTURE) ×1 IMPLANT
SUT VIC AB 4-0 KS 27 (SUTURE) ×3 IMPLANT
SYR BULB IRRIGATION 50ML (SYRINGE) ×3 IMPLANT
TOWEL OR 17X24 6PK STRL BLUE (TOWEL DISPOSABLE) ×3 IMPLANT
TRAY FOLEY CATH SILVER 14FR (SET/KITS/TRAYS/PACK) ×3 IMPLANT

## 2016-12-03 NOTE — Anesthesia Procedure Notes (Signed)
Spinal  Patient location during procedure: OR Start time: 12/03/2016 12:22 PM End time: 12/03/2016 12:26 PM Staffing Anesthesiologist: Leilani AbleHATCHETT, Ladana Chavero Performed: anesthesiologist  Preanesthetic Checklist Completed: patient identified, surgical consent, pre-op evaluation, timeout performed, IV checked, risks and benefits discussed and monitors and equipment checked Spinal Block Patient position: sitting Prep: site prepped and draped and DuraPrep Patient monitoring: heart rate, cardiac monitor, continuous pulse ox and blood pressure Approach: midline Location: L3-4 Injection technique: single-shot Needle Needle type: Sprotte  Needle gauge: 24 G Needle length: 9 cm Needle insertion depth: 5 cm Assessment Sensory level: T4

## 2016-12-03 NOTE — Transfer of Care (Signed)
Immediate Anesthesia Transfer of Care Note  Patient: Colleen Crawford  Procedure(s) Performed: Procedure(s) with comments: CESAREAN SECTION (N/A) - Heather K to RNFA  Patient Location: PACU  Anesthesia Type:Spinal  Level of Consciousness: awake, alert  and oriented  Airway & Oxygen Therapy: Patient Spontanous Breathing  Post-op Assessment: Report given to RN and Post -op Vital signs reviewed and stable  Post vital signs: Reviewed and stable BP 118/56, HR62, RR12, SaO2 98%  Last Vitals:  Vitals:   12/03/16 1138  Resp: 18  Temp: 36.8 C    Last Pain:  Vitals:   12/03/16 1139  TempSrc:   PainSc: 0-No pain         Complications: No apparent anesthesia complications

## 2016-12-03 NOTE — Anesthesia Postprocedure Evaluation (Signed)
Anesthesia Post Note  Patient: Colleen Crawford  Procedure(s) Performed: Procedure(s) (LRB): CESAREAN SECTION (N/A)  Patient location during evaluation: PACU Anesthesia Type: Spinal Level of consciousness: awake Pain management: pain level controlled Vital Signs Assessment: post-procedure vital signs reviewed and stable Respiratory status: spontaneous breathing Cardiovascular status: stable Postop Assessment: no headache, no backache, spinal receding, patient able to bend at knees and no signs of nausea or vomiting Anesthetic complications: no        Last Vitals:  Vitals:   12/03/16 1430 12/03/16 1445  BP: 139/86 137/74  Pulse: 75 64  Resp: 18 18  Temp:  37.3 C    Last Pain:  Vitals:   12/03/16 1445  TempSrc: Oral  PainSc:    Pain Goal:                 Lilja Soland JR,JOHN Rolfe Hartsell

## 2016-12-03 NOTE — Addendum Note (Signed)
Addendum  created 12/03/16 2206 by Graciela HusbandsWynn O Cathline Dowen, CRNA   Sign clinical note

## 2016-12-03 NOTE — Progress Notes (Signed)
Orthostatic vitals obtained. Pt c/o dizziness upon sitting and standing. Pt returned to bed. Will get pt up to walk at a later time when dizziness disappears.     12/03/16 2239 12/03/16 2244 12/03/16 2245  Orthostatic Lying   BP- Lying 121/43 --  --   Pulse- Lying 65 --  --   Orthostatic Sitting  BP- Sitting --  127/62 --   Pulse- Sitting --  74 --   Orthostatic Standing at 0 minutes  BP- Standing at 0 minutes --  --  111/58 (C/o dizziness upon standing-had to sit back down after vital)  Pulse- Standing at 0 minutes --  --  70

## 2016-12-03 NOTE — Brief Op Note (Signed)
12/03/2016  1:31 PM  PATIENT:  Colleen Crawford  41 y.o. female  PRE-OPERATIVE DIAGNOSIS:  repeat c-section, hx vertical incision for ruptured appendix, GDM,   PREECLAMPSIA  POST-OPERATIVE DIAGNOSIS:  repeat c-section, hx vertical incision for ruptured appendix, GDM,  PREECLAMPSIA, delivered  PROCEDURE:  Procedure(s) with comments: CESAREAN SECTION (N/A) - Heather K to RNFA rLTCS   SURGEON:  Surgeon(s) and Role:    * Sherian ReinJody Bovard-Stuckert, MD - Primary  ASSISTANTS: Webb SilversmithKrietemeyer, Heather RNFA   ANESTHESIA:   spinal  FINDINGS: viable female infant at 12:45, apgars 8/9; wt P; nl PP uterus, tubes and ovaries.    EBL:  Total I/O In: 700 [I.V.:700] Out: 800 [Urine:100; Blood:700]  BLOOD ADMINISTERED:none  DRAINS: Urinary Catheter (Foley)   LOCAL MEDICATIONS USED:  NONE  SPECIMEN:  Source of Specimen:  Placenta  DISPOSITION OF SPECIMEN:  L&D  COUNTS:  YES  TOURNIQUET:  * No tourniquets in log *  DICTATION: .Other Dictation: Dictation Number [id number]  PLAN OF CARE: Admit to inpatient   PATIENT DISPOSITION:  PACU - hemodynamically stable.   Delay start of Pharmacological VTE agent (>24hrs) due to surgical blood loss or risk of bleeding: not applicable

## 2016-12-03 NOTE — Lactation Note (Signed)
This note was copied from a baby's chart. Lactation Consultation Note Initial visit at 7 hours of age.  Mom reports baby recently breastfed for about 30 minutes and reports some nipple pain.  Baby awake showing feeding cues.  Baby is 6015w0d ad 7#4 oz.  Mom is experienced with 2 older children nursing for 2 years with limited formula supplementation. Mom denies problems with hypothyroidism.   LC changed large stool/void diaper.  LC undressed baby to place STS with mom Mom instructed on hand expression with drops expressed.  Mom is able to return demonstration. LC encouraged mom to hand express before and after feedings.  LC discussed spoon feeding as needed with minimal education about LPT baby as may not be relevant for this baby.  LC assisted with cross cradle hold with mom reclined back ( recovering from c/s) Baby latched well with wide gape and few swallows with compressions noted.  Baby maintained good feedings and mom denies pain. LC explained importance of deep latch for comfort and transfer of milk.   [facility] resources given and discussed.  Encouraged to feed with early cues on demand.  Early newborn behavior discussed.  Mom to call for assist as needed.          Patient Name: Boy Earlean PolkaSaida Morocco ZOXWR'UToday's Date: 12/03/2016 Reason for consult: Initial assessment   Maternal Data Has patient been taught Hand Expression?: Yes Does the patient have breastfeeding experience prior to this delivery?: Yes  Feeding Feeding Type: Breast Fed Length of feed:  (observed 10 minutes)  LATCH Score/Interventions Latch: Grasps breast easily, tongue down, lips flanged, rhythmical sucking. Intervention(s): Adjust position;Assist with latch;Breast massage;Breast compression  Audible Swallowing: A few with stimulation  Type of Nipple: Everted at rest and after stimulation  Comfort (Breast/Nipple): Soft / non-tender     Hold (Positioning): Assistance needed to correctly position infant at breast and  maintain latch. Intervention(s): Breastfeeding basics reviewed;Support Pillows;Position options;Skin to skin  LATCH Score: 8  Lactation Tools Discussed/Used WIC Program: Yes   Consult Status Consult Status: Follow-up Date: 12/04/16 Follow-up type: In-patient    Shoptaw, Arvella MerlesJana Lynn 12/03/2016, 8:11 PM

## 2016-12-03 NOTE — Anesthesia Postprocedure Evaluation (Addendum)
Anesthesia Post Note  Patient: Colleen Crawford  Procedure(s) Performed: Procedure(s) (LRB): CESAREAN SECTION (N/A)  Patient location during evaluation: Mother Baby Anesthesia Type: Spinal Level of consciousness: awake and alert and oriented Pain management: satisfactory to patient Vital Signs Assessment: post-procedure vital signs reviewed and stable Respiratory status: respiratory function stable and spontaneous breathing Cardiovascular status: blood pressure returned to baseline Postop Assessment: no headache, no backache, spinal receding, patient able to bend at knees and adequate PO intake Anesthetic complications: no        Last Vitals:  Vitals:   12/03/16 1755 12/03/16 1906  BP: (!) 124/59 (!) 143/62  Pulse: 60 63  Resp: 18 20  Temp: 36.8 C 36.7 C    Last Pain:  Vitals:   12/03/16 1951  TempSrc:   PainSc: 0-No pain   Pain Goal:                 FUSSELL,WYNN

## 2016-12-03 NOTE — Interval H&P Note (Signed)
History and Physical Interval Note:  12/03/2016 1:30 PM  Colleen PolkaSaida Grunewald  has presented today for surgery, with the diagnosis of repeat c-section, hx vertical incision for ruptured  PREECLAMPSIA  The various methods of treatment have been discussed with the patient and family. After consideration of risks, benefits and other options for treatment, the patient has consented to  Procedure(s) with comments: CESAREAN SECTION (N/A) - Heather K to RNFA as a surgical intervention .  The patient's history has been reviewed, patient examined, no change in status, stable for surgery.  I have reviewed the patient's chart and labs.  Questions were answered to the patient's satisfaction.     Bovard-Stuckert, Rozann Holts

## 2016-12-03 NOTE — Anesthesia Preprocedure Evaluation (Signed)
Anesthesia Evaluation  Patient identified by MRN, date of birth, ID band Patient awake    Reviewed: Allergy & Precautions, H&P , NPO status , Patient's Chart, lab work & pertinent test results  Airway Mallampati: I  TM Distance: >3 FB Neck ROM: full    Dental no notable dental hx.    Pulmonary neg pulmonary ROS,    Pulmonary exam normal        Cardiovascular hypertension, Normal cardiovascular exam     Neuro/Psych negative neurological ROS  negative psych ROS   GI/Hepatic negative GI ROS, Neg liver ROS,   Endo/Other  diabetes  Renal/GU negative Renal ROS     Musculoskeletal   Abdominal Normal abdominal exam  (+)   Peds  Hematology negative hematology ROS (+)   Anesthesia Other Findings   Reproductive/Obstetrics (+) Pregnancy                             Anesthesia Physical Anesthesia Plan  ASA: II  Anesthesia Plan: Spinal   Post-op Pain Management:    Induction:   Airway Management Planned:   Additional Equipment:   Intra-op Plan:   Post-operative Plan:   Informed Consent: I have reviewed the patients History and Physical, chart, labs and discussed the procedure including the risks, benefits and alternatives for the proposed anesthesia with the patient or authorized representative who has indicated his/her understanding and acceptance.     Plan Discussed with: CRNA and Surgeon  Anesthesia Plan Comments:         Anesthesia Quick Evaluation

## 2016-12-04 ENCOUNTER — Encounter (HOSPITAL_COMMUNITY): Payer: Self-pay | Admitting: Obstetrics and Gynecology

## 2016-12-04 LAB — CBC
HEMATOCRIT: 32.2 % — AB (ref 36.0–46.0)
Hemoglobin: 10.7 g/dL — ABNORMAL LOW (ref 12.0–15.0)
MCH: 28.2 pg (ref 26.0–34.0)
MCHC: 33.2 g/dL (ref 30.0–36.0)
MCV: 84.7 fL (ref 78.0–100.0)
PLATELETS: 211 10*3/uL (ref 150–400)
RBC: 3.8 MIL/uL — ABNORMAL LOW (ref 3.87–5.11)
RDW: 14.7 % (ref 11.5–15.5)
WBC: 15.3 10*3/uL — ABNORMAL HIGH (ref 4.0–10.5)

## 2016-12-04 LAB — BIRTH TISSUE RECOVERY COLLECTION (PLACENTA DONATION)

## 2016-12-04 NOTE — Progress Notes (Signed)
Subjective: Postpartum Day 1: Cesarean Delivery Patient reports incisional pain and tolerating PO.  Pain and nausea controlled.  Nl lochia  Objective: Vital signs in last 24 hours: Temp:  [97.9 F (36.6 C)-99.1 F (37.3 C)] 97.9 F (36.6 C) (03/10 0646) Pulse Rate:  [60-75] 74 (03/10 0646) Resp:  [10-26] 20 (03/10 0646) BP: (105-143)/(43-86) 105/54 (03/10 0646) SpO2:  [88 %-98 %] 97 % (03/10 0646) Weight:  [73 kg (161 lb)] 73 kg (161 lb) (03/09 1138)  Physical Exam:  General: alert and no distress Lochia: appropriate Uterine Fundus: firm Incision: healing well DVT Evaluation: No evidence of DVT seen on physical exam.   Recent Labs  12/02/16 1200 12/04/16 0517  HGB 11.7* 10.7*  HCT 35.2* 32.2*    Assessment/Plan: Status post Cesarean section. Doing well postoperatively.  Continue current care.  Bovard-Stuckert, Kenith Trickel 12/04/2016, 8:17 AM

## 2016-12-04 NOTE — Lactation Note (Signed)
This note was copied from a baby's chart. Lactation Consultation Note Experienced breastfeeding mom : 41 year old she bf for 2 years.  Infant asleep in her arms having just fed within the hour.  Mom states she is not so sure infant is opening wide enough.  LC examines breasts and notices slight redness on both nipples.   LC hand expressed with moms permission and right breast easily produced gtts of colostrum which were left on nipple and placed on left side as well.  LC encouraged hand expression prior to feeding in order to entice infant to open more widely.   Instructed mom to hand express after feeds in order to protect nipples with colostrum.  LC offered to get mom some coconut oil to leave on nipples after feeds.  Mom accepted, LC left oil with MBU RN to give to mom: mom requested to rest at this time because tech came in to take baby for hearing screen.  LC left name on board and mom agreed to call out with next feed for latch observation and assistance with positioning. Will follow up as mother desires. Patient Name: Colleen Earlean PolkaSaida Scearce ZOXWR'UToday's Date: 12/04/2016 Reason for consult: Follow-up assessment   Maternal Data    Feeding    LATCH Score/Interventions                      Lactation Tools Discussed/Used     Consult Status Consult Status: Follow-up Date: 12/05/16 Follow-up type: In-patient    Maryruth HancockKelly Suzanne [facility] 12/04/2016, 3:18 PM

## 2016-12-05 MED ORDER — IBUPROFEN 800 MG PO TABS: 800.0000 mg | ORAL_TABLET | Freq: Three times a day (TID) | ORAL | 1 refills | Status: DC

## 2016-12-05 MED ORDER — OXYCODONE HCL 5 MG PO TABS: 5.0000 mg | ORAL_TABLET | Freq: Four times a day (QID) | ORAL | 0 refills | Status: DC | PRN

## 2016-12-05 MED ORDER — PRENATAL MULTIVITAMIN CH: 1.0000 | ORAL_TABLET | Freq: Every day | ORAL | 3 refills | Status: DC

## 2016-12-05 NOTE — Progress Notes (Addendum)
Subjective: Postpartum Day 2: Cesarean Delivery Patient reports incisional pain, tolerating PO and no problems voiding. Nl lochia, pain controlled   Objective: Vital signs in last 24 hours: Temp:  [98 F (36.7 C)-98.4 F (36.9 C)] 98.4 F (36.9 C) (03/11 0634) Pulse Rate:  [68-73] 68 (03/11 0634) Resp:  [16-18] 18 (03/11 0634) BP: (136-147)/(59-60) 147/60 (03/11 0634) SpO2:  [99 %] 99 % (03/10 1820)  Physical Exam:  General: alert and no distress Lochia: appropriate Uterine Fundus: firm Incision: healing well DVT Evaluation: No evidence of DVT seen on physical exam.   Recent Labs  12/02/16 1200 12/04/16 0517  HGB 11.7* 10.7*  HCT 35.2* 32.2*    Assessment/Plan: Status post Cesarean section. Doing well postoperatively.  Continue current care.  Pt desires d/c to home.  Will d/c with Motrin, Oxycodone and PNV.  F/u 2 weeks  Colleen Crawford, Colleen Crawford 12/05/2016, 10:00 AM

## 2016-12-05 NOTE — Discharge Summary (Signed)
OB Discharge Summary     Patient Name: Colleen Crawford DOB: 28-Apr-1976 MRN: [id number]  Date of admission: 12/03/2016 Delivering MD: Sherian ReinBOVARD-STUCKERT, Mozelle Remlinger   Date of discharge: 12/05/2016  Admitting diagnosis: PREG repeat c-section, hx vertical incision for ruptured appendix Intrauterine pregnancy: 4245w0d     Secondary diagnosis:  Principal Problem:   S/P cesarean section Active Problems:   Gestational diabetes mellitus (GDM) in childbirth, diet controlled   Preexisting diabetes complicating pregnancy in third trimester, antepartum  Additional problems: N/A     Discharge diagnosis: Term Pregnancy Delivered                                                                                                Post partum procedures:N/A  Augmentation: N/A  Complications: None  Hospital course:  Sceduled C/S   41 y.o. yo [id number] at 6445w0d was admitted to the hospital 12/03/2016 for scheduled cesarean section with the following indication:Elective Repeat.  Membrane Rupture Time/Date: 12:45 PM ,12/03/2016   Patient delivered a Viable infant.12/03/2016  Details of operation can be found in separate operative note.  Pateint had an uncomplicated postpartum course.  [name], tolerating a regular diet, passing flatus, and urinating well. Patient is discharged home in stable condition on  12/05/16         Physical exam  Vitals:   12/04/16 0646 12/04/16 0943 12/04/16 1820 12/05/16 0634  BP: (!) 105/54 126/64 (!) 136/59 (!) 147/60  Pulse: 74 66 73 68  Resp: 20 20 16 18   Temp: 97.9 F (36.6 C) 98 F (36.7 C) 98 F (36.7 C) 98.4 F (36.9 C)  TempSrc: Oral Oral Oral Oral  SpO2: 97%  99%   Weight:      Height:       General: alert and no distress Lochia: appropriate Uterine Fundus: firm Incision: Healing well with no significant drainage DVT Evaluation: No evidence of DVT seen on physical exam. Labs: Lab Results  Component Value Date   WBC 15.3 (H) 12/04/2016   HGB 10.7 (L)  12/04/2016   HCT 32.2 (L) 12/04/2016   MCV 84.7 12/04/2016   PLT 211 12/04/2016   CMP Latest Ref Rng & Units 12/02/2016  Glucose 65 - 99 mg/dL 962(X121(H)  BUN 6 - 20 mg/dL 9  Creatinine 5.280.44 - 4.131.00 mg/dL 2.440.68  Sodium 010135 - 272145 mmol/L 135  Potassium 3.5 - 5.1 mmol/L 3.5  Chloride 101 - 111 mmol/L 104  CO2 22 - 32 mmol/L 23  Calcium 8.9 - 10.3 mg/dL 9.0  Total Protein 6.5 - 8.1 g/dL 7.0  Total Bilirubin 0.3 - 1.2 mg/dL 0.5  Alkaline Phos 38 - 126 U/L 83  AST 15 - 41 U/L 24  ALT 14 - 54 U/L 20    Discharge instruction: per After Visit Summary and "Baby and Me Booklet".  After visit meds:  Allergies as of 12/05/2016   No Known Allergies     Medication List    STOP taking these medications   ferrous sulfate 325 (65 FE) MG tablet     TAKE these medications   acetaminophen 500 MG tablet  Commonly known as:  TYLENOL Take 500 mg by mouth every 6 (six) hours as needed (for pain).   ibuprofen 800 MG tablet Commonly known as:  ADVIL,MOTRIN Take 1 tablet (800 mg total) by mouth every 8 (eight) hours.   oxyCODONE 5 MG immediate release tablet Commonly known as:  Oxy IR/ROXICODONE Take 1 tablet (5 mg total) by mouth every 6 (six) hours as needed for severe pain (pain scale 4-7).   prenatal multivitamin Tabs tablet Take 1 tablet by mouth daily.       Diet: routine diet  Activity: Advance as tolerated. Pelvic rest for 6 weeks.   Outpatient follow up:2 and 6 weeks Follow up Appt:No future appointments. Follow up Visit:No Follow-up on file.  Postpartum contraception: Undecided  Newborn Data: Live born female  Birth Weight: 7 lb 4.2 oz (3295 g) APGAR: 8, 9  Baby Feeding: Breast Disposition:home with mother   12/05/2016 Sherian Rein, MD

## 2016-12-05 NOTE — Lactation Note (Signed)
This note was copied from a baby's chart. Lactation Consultation Note  Mother reports that BF is going well except for some nipple tenderness.  She has a history of this with her other children and it improved over the course of the first 2 weeks.  Her breasts are also quite full.  Shallow cracking noted at nipple areolar juncture. Encouraged BM and coconut oil to area.  Explained how to obtain a deeper latch.  Taught therapeutic breast massage of lactation and breasts became softer.  Will contact lactation services as an OP if needed.  Patient Name: Colleen Crawford Reason for consult: Follow-up assessment   Maternal Data    Feeding Feeding Type: Breast Fed Length of feed: 15 min  LATCH Score/Interventions                      Lactation Tools Discussed/Used     Consult Status Consult Status: Complete    Colleen Crawford, Colleen Crawford Crawford, 10:14 AM

## 2016-12-06 NOTE — Op Note (Signed)
NAMEarlean Polka:  Valentino, Sanii                ACCOUNT NO.:  [id number]  MEDICAL RECORD NO.:  [id number]  LOCATION:                                 FACILITY:  PHYSICIAN:  Sherron MondayJody Bovard, MD             DATE OF BIRTH:  DATE OF PROCEDURE:  12/03/2016 DATE OF DISCHARGE:                              OPERATIVE REPORT   PREOPERATIVE DIAGNOSES:  Intrauterine pregnancy at 37 weeks with preeclampsia and gestational diabetes. repeat low- transverse cesarean section, who has a history of vertical abdominal incision for a ruptured appendix.  POSTOPERATIVE DIAGNOSES:  Intrauterine pregnancy at 37 weeks with preeclampsia and gestational diabetes.  repeat low- transverse cesarean section, who has a history of vertical abdominal incision for a ruptured appendix, delivered, delivered.  OPERATION:  Repeat low transverse cesarean section.  SURGEON:  Sherron MondayJody Bovard, MD.  ASSISTANWebb Silversmith:  Krietemeyer, Heather, RNFA.  ANESTHESIA:  Spinal.  FINDINGS:  Viable female infant at [id number] with Apgars of 8 at 1 minute and 9 at 5 minutes, and weight pending at the time of dictation.  Normal postpartum uterus, tubes, and ovaries were noted.  ESTIMATED BLOOD LOSS:  Approximately 700 mL.  IV FLUIDS:  700 mL.  URINE OUTPUT:  100 mL, clear urine at the end of the procedure.  COMPLICATIONS:  None.  PATHOLOGY:  Placenta to Labor and Delivery.  PROCEDURE:  After informed consent was reviewed with the patient including risks, benefits, and alternatives of the surgical procedure, she was transported to the OR where spinal anesthesia was placed and found to be adequate.  She was then returned to the supine position with a leftward tilt, prepped and draped in the normal sterile fashion.  A Foley catheter was sterilely placed.  A Pfannenstiel skin incision was made at the level of her previous incision approximately 2 fingerbreadths above the pubic symphysis.  This incision was carried to the underlying layer of fascia sharply.  The  fascia was incised in the midline, and the midline incision was extended laterally with Mayo scissors.  The superior aspect of the fascial incision was grasped with Kocher clamps, elevated, and the rectus muscles were dissected off both bluntly and sharply.  The midline was easily identified, and the peritoneum was entered bluntly.  The incision was extended superiorly and inferiorly with good visualization of the bladder.  The Alexis skin retractor was placed carefully making sure that no bowel was entrapped. The uterus was explored.  A uterine incision was made transversely and manually extended.  The infant was delivered from a vertex presentation. The cord then cut was clamped on the field and the baby was suctioned, handed off to the awaiting pediatric staff.  After a minute had passed, before the cord was clamped, the placenta was expressed and the uterus was cleared of all clot and debris.  Uterine incision was closed with 2 layers of 0 Monocryl, first of which is running locked and 2nd was an imbricating layer.  The gutters were cleared of all clot and debris and the anatomy was inspected and found to be normal.  The Alexis retractor was removed.  The peritoneum was reapproximated using 2-0 Vicryl.  The subfascial planes were inspected and found to be hemostatic.  The fascia was closed with #0 Vicryl from either aspect of the incision, overlapping in the midline.  The subcuticular adipose layer was made hemostatic with Bovie cautery and the dead space was closed with plain gut.  The skin was reapproximated in a subcuticular fashion with a Mellody Dance needle with 4-0 Vicryl on a Keith needle.  The patient tolerated the procedure well.  Sponge, lap, and needle count were correct x2 per the operating staff.     Sherron Monday, MD   ______________________________ Sherron Monday, MD    JB/MEDQ  D:  12/03/2016  T:  12/03/2016  Job:  [id number]

## 2017-03-04 NOTE — Addendum Note (Signed)
Addendum  created 03/04/17 1045 by Malahki Gasaway, MD   Sign clinical note    

## 2018-12-27 ENCOUNTER — Encounter (HOSPITAL_COMMUNITY): Payer: Self-pay | Admitting: *Deleted

## 2018-12-28 ENCOUNTER — Telehealth (HOSPITAL_COMMUNITY): Payer: Self-pay | Admitting: *Deleted

## 2018-12-28 NOTE — Telephone Encounter (Signed)
Preadmission screen  

## 2019-01-01 ENCOUNTER — Encounter (HOSPITAL_COMMUNITY): Payer: Self-pay

## 2019-01-01 NOTE — Patient Instructions (Signed)
Colleen Crawford  01/01/2019   Your procedure is scheduled on:  01/10/2019  Arrive at 0600 at Entrance C on [address] at [facility]  and [employer]. You are invited to use the FREE valet parking or use the Visitor's parking deck.  Pick up the phone at the desk and dial [phone].  Call this number if you have problems the morning of surgery: [phone]  Remember:   Do not eat food:(After Midnight) Desps de medianoche.  Do not drink clear liquids: (After Midnight) Desps de medianoche.  Take these medicines the morning of surgery with A SIP OF WATER:  Do not take metformin the night before or the morning of surgery   Do not wear jewelry, make-up or nail polish.  Do not wear lotions, powders, or perfumes. Do not wear deodorant.  Do not shave 48 hours prior to surgery.  Do not bring valuables to the hospital.  [facility] is not   responsible for any belongings or valuables brought to the hospital.  Contacts, dentures or bridgework may not be worn into surgery.  Leave suitcase in the car. After surgery it may be brought to your room.  For patients admitted to the hospital, checkout time is 11:00 AM the day of              discharge.      Please read over the following fact sheets that you were given:     Preparing for Surgery

## 2019-01-01 NOTE — Telephone Encounter (Signed)
Preadmission screen  

## 2019-01-09 ENCOUNTER — Encounter (HOSPITAL_COMMUNITY)
Admission: RE | Admit: 2019-01-09 | Discharge: 2019-01-09 | Disposition: A | Discharge status: Home / Self Care | Payer: Medicaid Other | Source: Ambulatory Visit

## 2019-01-09 NOTE — H&P (Signed)
Colleen Crawford is a 43 y.o. female [id number] at 16+ with PreE and GDM for rLTCS.  Has pfannensteil incision and midline vertical.  Pregnancy complicated by AMA (normal panorama, female), h/o GDM and PIH, PreEclampsia and Gestational diabetes controlled with oral metformin bid.  D/W pt r/b/a of repeat cesarean delivery.  She will be alone as family unable to come and they have other children to care for.    OB History    Gravida  5   Para  2   Term  2   Preterm  0   AB  2   Living  2     SAB  2   TAB  0   Ectopic  0   Multiple  0   Live Births  2         G1 CPD - 7#5, midline vertical incision, female 2013 G2 and G3 SAB G4 2018, female, LTCS, 7#4, PIH and GDM G5 present  No abn pap, last 7/18 HR HPV neg No STDs Past Medical History:  Diagnosis Date  . Gestational diabetes 2013 and 2018  . Gestational diabetes mellitus (GDM) in childbirth, diet controlled 12/02/2016  . Hyperthyroidism   . Preexisting diabetes complicating pregnancy in third trimester, antepartum 12/02/2016  . Pregnancy induced hypertension 2013  . S/P cesarean section 12/03/2016   Past Surgical History:  Procedure Laterality Date  . APPENDECTOMY    . CESAREAN SECTION    . CESAREAN SECTION N/A 12/03/2016   Procedure: CESAREAN SECTION;  Surgeon: Sherian Rein, MD;  Location: [facility];  Service: Obstetrics;  Laterality: N/A;  Heather K to RNFA  . DILATION AND EVACUATION N/A 07/07/2014   Procedure: DILATATION AND EVACUATION;  Surgeon: Levie Heritage, DO;  Location: [facility];  Service: Gynecology;  Laterality: N/A;   Family History: family history includes Asthma in her sister; Diabetes in her maternal grandfather. Social History:  reports that she has never smoked. She has never used smokeless tobacco. She reports that she does not drink alcohol or use drugs.married, SAHM  Meds metformin and PNV All NKDA     Maternal Diabetes: Yes:  Diabetes Type:  Insulin/Medication controlled Genetic  Screening: Normal Maternal Ultrasounds/Referrals: Normal Fetal Ultrasounds or other Referrals:  None Maternal Substance Abuse:  No Significant Maternal Medications:  None Significant Maternal Lab Results:  Lab values include: Group B Strep positive Other Comments:  metformin 1000 qhs/500 qam, nl panorama  Review of Systems  Constitutional: Negative.   HENT: Negative.   Eyes: Negative.   Respiratory: Negative.   Cardiovascular: Negative.   Gastrointestinal: Negative.   Genitourinary: Negative.   Musculoskeletal: Positive for back pain.  Skin: Negative.   Neurological: Negative.   Psychiatric/Behavioral: Negative.    Maternal Medical History:  Contractions: Frequency: irregular.    Fetal activity: Perceived fetal activity is normal.    Prenatal complications: Pre-eclampsia.   Prenatal Complications - Diabetes: gestational. Diabetes is managed by oral agent (monotherapy).        Last menstrual period 04/26/2018, unknown if currently breastfeeding. Maternal Exam:  Uterine Assessment: Contraction strength is mild.  Contraction frequency is irregular.   Abdomen: Patient reports no abdominal tenderness. Surgical scars: low transverse.   Fundal height is appropriate for gestation.   Estimated fetal weight is 7-8#.   Fetal presentation: vertex Pfannensteil and midline vertical incisions  Introitus: Normal vulva. Normal vagina.    Physical Exam  Constitutional: She is oriented to person, place, and time. She appears well-developed and well-nourished.  HENT:  Head: Normocephalic and atraumatic.  Cardiovascular: Normal rate and regular rhythm.  Respiratory: Effort normal and breath sounds normal. No respiratory distress. She has no wheezes.  GI: Soft. Bowel sounds are normal. She exhibits no distension. There is no abdominal tenderness.  Genitourinary:    Vulva normal.   Musculoskeletal: Normal range of motion.  Neurological: She is alert and oriented to person, place, and  time.  Skin: Skin is warm and dry.  Psychiatric: She has a normal mood and affect. Her behavior is normal.    Prenatal labs: ABO, Rh:  A+ Antibody:  neg Rubella:  immune RPR:   NR HBsAg:   neg HIV:   neg GBS:   positive  Hgb 12.9/Plt 308/Ur Cx neg/GC neg/Chl neg/Varicella immune/ Dated by LMP, nl NT glucola 189, failed 3 hr GTT US nl limited anat, ant plac, female F/u US completes anatomy  Assessment/Plan: 41JW [id number] G5P2022 at 37+ for rLTCS, also GDM and PIH D/w pt r/b/a of rLTCS, will proceed Nursery to be informed gbbs + Ancef for prophylaxis    Lorrie Strauch Bovard-Stuckert 01/09/2019, 5:53 PM

## 2019-01-10 ENCOUNTER — Encounter (HOSPITAL_COMMUNITY): Payer: Self-pay | Admitting: *Deleted

## 2019-01-10 ENCOUNTER — Inpatient Hospital Stay (HOSPITAL_COMMUNITY): Payer: Medicaid Other | Admitting: Anesthesiology

## 2019-01-10 ENCOUNTER — Other Ambulatory Visit: Payer: Self-pay

## 2019-01-10 ENCOUNTER — Inpatient Hospital Stay (HOSPITAL_COMMUNITY)
Admission: AD | Admit: 2019-01-10 | Discharge: 2019-01-12 | DRG: 788 | Disposition: A | Discharge status: Home / Self Care | Payer: Medicaid Other | Attending: Obstetrics and Gynecology | Admitting: Obstetrics and Gynecology

## 2019-01-10 ENCOUNTER — Encounter (HOSPITAL_COMMUNITY)
Admission: AD | Disposition: A | Discharge status: Home / Self Care | Payer: Self-pay | Source: Home / Self Care | Attending: Obstetrics and Gynecology

## 2019-01-10 DIAGNOSIS — O24425 Gestational diabetes mellitus in childbirth, controlled by oral hypoglycemic drugs: Secondary | ICD-10-CM | POA: Diagnosis present

## 2019-01-10 DIAGNOSIS — Z3A37 37 weeks gestation of pregnancy: Secondary | ICD-10-CM | POA: Diagnosis not present

## 2019-01-10 DIAGNOSIS — O34211 Maternal care for low transverse scar from previous cesarean delivery: Principal | ICD-10-CM | POA: Diagnosis present

## 2019-01-10 DIAGNOSIS — Z98891 History of uterine scar from previous surgery: Secondary | ICD-10-CM

## 2019-01-10 DIAGNOSIS — O1494 Unspecified pre-eclampsia, complicating childbirth: Secondary | ICD-10-CM | POA: Diagnosis present

## 2019-01-10 DIAGNOSIS — O99824 Streptococcus B carrier state complicating childbirth: Secondary | ICD-10-CM | POA: Diagnosis present

## 2019-01-10 LAB — COMPREHENSIVE METABOLIC PANEL
ALT: 22 U/L (ref 0–44)
AST: 26 U/L (ref 15–41)
Albumin: 2.6 g/dL — ABNORMAL LOW (ref 3.5–5.0)
Alkaline Phosphatase: 87 U/L (ref 38–126)
Anion gap: 11 (ref 5–15)
BUN: 10 mg/dL (ref 6–20)
CO2: 22 mmol/L (ref 22–32)
Calcium: 9.7 mg/dL (ref 8.9–10.3)
Chloride: 106 mmol/L (ref 98–111)
Creatinine, Ser: 0.67 mg/dL (ref 0.44–1.00)
GFR calc Af Amer: >60 mL/min (ref >60)
GFR calc non Af Amer: >60 mL/min (ref >60)
Glucose, Bld: 104 mg/dL — ABNORMAL HIGH (ref 70–99)
Potassium: 4 mmol/L (ref 3.5–5.1)
Sodium: 139 mmol/L (ref 135–145)
Total Bilirubin: 0.5 mg/dL (ref 0.3–1.2)
Total Protein: 5.6 g/dL — ABNORMAL LOW (ref 6.5–8.1)

## 2019-01-10 LAB — CBC
HCT: 35.3 % — ABNORMAL LOW (ref 36.0–46.0)
Hemoglobin: 11 g/dL — ABNORMAL LOW (ref 12.0–15.0)
MCH: 27.2 pg (ref 26.0–34.0)
MCHC: 31.2 g/dL (ref 30.0–36.0)
MCV: 87.4 fL (ref 80.0–100.0)
Platelets: 234 10*3/uL (ref 150–400)
RBC: 4.04 MIL/uL (ref 3.87–5.11)
RDW: 14.4 % (ref 11.5–15.5)
WBC: 12.3 10*3/uL — ABNORMAL HIGH (ref 4.0–10.5)
nRBC: 0 % (ref 0.0–0.2)

## 2019-01-10 LAB — TYPE AND SCREEN
ABO/RH(D): A POS
Antibody Screen: NEGATIVE

## 2019-01-10 LAB — GLUCOSE, CAPILLARY
Glucose-Capillary: 92 mg/dL (ref 70–99)
Glucose-Capillary: 108 mg/dL — ABNORMAL HIGH (ref 70–99)

## 2019-01-10 LAB — ABO/RH: ABO/RH(D): A POS

## 2019-01-10 SURGERY — Surgical Case
Anesthesia: Spinal | Site: Abdomen | Wound class: Clean Contaminated

## 2019-01-10 MED ORDER — OXYTOCIN 10 UNIT/ML IJ SOLN
INTRAMUSCULAR | Status: DC | PRN
  Administered 2019-01-10: 40 [IU] via INTRAMUSCULAR

## 2019-01-10 MED ORDER — KETOROLAC TROMETHAMINE 30 MG/ML IJ SOLN: 30.0000 mg | Freq: Four times a day (QID) | INTRAMUSCULAR | Status: AC | PRN

## 2019-01-10 MED ORDER — METOCLOPRAMIDE HCL 5 MG/ML IJ SOLN
INTRAMUSCULAR | Status: AC
  Filled 2019-01-10: qty 2

## 2019-01-10 MED ORDER — DIPHENHYDRAMINE HCL 25 MG PO CAPS: 25.0000 mg | ORAL_CAPSULE | Freq: Four times a day (QID) | ORAL | Status: DC | PRN

## 2019-01-10 MED ORDER — DEXAMETHASONE SODIUM PHOSPHATE 4 MG/ML IJ SOLN
INTRAMUSCULAR | Status: DC | PRN
  Administered 2019-01-10: 4 mg via INTRAVENOUS

## 2019-01-10 MED ORDER — CEFAZOLIN SODIUM-DEXTROSE 2-4 GM/100ML-% IV SOLN
INTRAVENOUS | Status: AC
  Filled 2019-01-10: qty 100

## 2019-01-10 MED ORDER — COCONUT OIL OIL: 1.0000 "application " | TOPICAL_OIL | Status: DC | PRN

## 2019-01-10 MED ORDER — KETOROLAC TROMETHAMINE 30 MG/ML IJ SOLN
30.0000 mg | Freq: Four times a day (QID) | INTRAMUSCULAR | Status: AC | PRN
  Administered 2019-01-10: 30 mg via INTRAVENOUS

## 2019-01-10 MED ORDER — NALBUPHINE HCL 10 MG/ML IJ SOLN: 5.0000 mg | INTRAMUSCULAR | Status: DC | PRN

## 2019-01-10 MED ORDER — MEPERIDINE HCL 25 MG/ML IJ SOLN: 6.2500 mg | INTRAMUSCULAR | Status: DC | PRN

## 2019-01-10 MED ORDER — KETOROLAC TROMETHAMINE 30 MG/ML IJ SOLN
INTRAMUSCULAR | Status: AC
  Filled 2019-01-10: qty 1

## 2019-01-10 MED ORDER — NALOXONE HCL 4 MG/10ML IJ SOLN
1.0000 ug/kg/h | INTRAVENOUS | Status: DC | PRN
  Filled 2019-01-10: qty 5

## 2019-01-10 MED ORDER — PROMETHAZINE HCL 25 MG/ML IJ SOLN: 6.2500 mg | INTRAMUSCULAR | Status: DC | PRN

## 2019-01-10 MED ORDER — IBUPROFEN 800 MG PO TABS
800.0000 mg | ORAL_TABLET | Freq: Three times a day (TID) | ORAL | Status: DC
  Administered 2019-01-10 – 2019-01-12 (×5): 800 mg via ORAL
  Filled 2019-01-10 (×5): qty 1

## 2019-01-10 MED ORDER — FENTANYL CITRATE (PF) 100 MCG/2ML IJ SOLN
INTRAMUSCULAR | Status: DC | PRN
  Administered 2019-01-10: 15 ug via INTRATHECAL

## 2019-01-10 MED ORDER — MORPHINE SULFATE (PF) 0.5 MG/ML IJ SOLN
INTRAMUSCULAR | Status: DC | PRN
  Administered 2019-01-10: 150 ug via INTRATHECAL

## 2019-01-10 MED ORDER — ONDANSETRON HCL 4 MG/2ML IJ SOLN
4.0000 mg | Freq: Three times a day (TID) | INTRAMUSCULAR | Status: DC | PRN
  Administered 2019-01-10: 10:00:00 4 mg via INTRAVENOUS

## 2019-01-10 MED ORDER — DEXAMETHASONE SODIUM PHOSPHATE 4 MG/ML IJ SOLN
INTRAMUSCULAR | Status: AC
  Filled 2019-01-10: qty 7

## 2019-01-10 MED ORDER — PHENYLEPHRINE HCL-NACL 20-0.9 MG/250ML-% IV SOLN
INTRAVENOUS | Status: DC | PRN
  Administered 2019-01-10: 60 ug/min via INTRAVENOUS

## 2019-01-10 MED ORDER — PHENYLEPHRINE HCL-NACL 20-0.9 MG/250ML-% IV SOLN
INTRAVENOUS | Status: AC
  Filled 2019-01-10: qty 250

## 2019-01-10 MED ORDER — SODIUM BICARBONATE 8.4 % IV SOLN: INTRAVENOUS | Status: DC | PRN

## 2019-01-10 MED ORDER — SCOPOLAMINE 1 MG/3DAYS TD PT72
MEDICATED_PATCH | TRANSDERMAL | Status: DC | PRN
  Administered 2019-01-10: 1 via TRANSDERMAL

## 2019-01-10 MED ORDER — PHENYLEPHRINE 40 MCG/ML (10ML) SYRINGE FOR IV PUSH (FOR BLOOD PRESSURE SUPPORT)
PREFILLED_SYRINGE | INTRAVENOUS | Status: AC
  Filled 2019-01-10: qty 10

## 2019-01-10 MED ORDER — SODIUM CHLORIDE 0.9% FLUSH: 3.0000 mL | INTRAVENOUS | Status: DC | PRN

## 2019-01-10 MED ORDER — SENNOSIDES-DOCUSATE SODIUM 8.6-50 MG PO TABS
2.0000 | ORAL_TABLET | ORAL | Status: DC
  Administered 2019-01-12: 2 via ORAL
  Filled 2019-01-10 (×2): qty 2

## 2019-01-10 MED ORDER — SODIUM CHLORIDE 0.9 % IV SOLN
INTRAVENOUS | Status: DC | PRN
  Administered 2019-01-10: 09:00:00 via INTRAVENOUS

## 2019-01-10 MED ORDER — NALBUPHINE HCL 10 MG/ML IJ SOLN: 5.0000 mg | Freq: Once | INTRAMUSCULAR | Status: DC | PRN

## 2019-01-10 MED ORDER — FENTANYL CITRATE (PF) 100 MCG/2ML IJ SOLN
INTRAMUSCULAR | Status: AC
  Filled 2019-01-10: qty 2

## 2019-01-10 MED ORDER — DIBUCAINE (PERIANAL) 1 % EX OINT: 1.0000 "application " | TOPICAL_OINTMENT | CUTANEOUS | Status: DC | PRN

## 2019-01-10 MED ORDER — NALOXONE HCL 0.4 MG/ML IJ SOLN: 0.4000 mg | INTRAMUSCULAR | Status: DC | PRN

## 2019-01-10 MED ORDER — WITCH HAZEL-GLYCERIN EX PADS: 1.0000 "application " | MEDICATED_PAD | CUTANEOUS | Status: DC | PRN

## 2019-01-10 MED ORDER — DIPHENHYDRAMINE HCL 50 MG/ML IJ SOLN: 12.5000 mg | INTRAMUSCULAR | Status: DC | PRN

## 2019-01-10 MED ORDER — KETOROLAC TROMETHAMINE 30 MG/ML IJ SOLN: 30.0000 mg | Freq: Once | INTRAMUSCULAR | Status: DC | PRN

## 2019-01-10 MED ORDER — ONDANSETRON HCL 4 MG/2ML IJ SOLN
INTRAMUSCULAR | Status: DC | PRN
  Administered 2019-01-10: 4 mg via INTRAVENOUS

## 2019-01-10 MED ORDER — SODIUM CHLORIDE 0.9 % IR SOLN
Status: DC | PRN
  Administered 2019-01-10: 1000 mL

## 2019-01-10 MED ORDER — ACETAMINOPHEN 500 MG PO TABS
1000.0000 mg | ORAL_TABLET | Freq: Four times a day (QID) | ORAL | Status: AC
  Administered 2019-01-10 – 2019-01-11 (×4): 1000 mg via ORAL
  Filled 2019-01-10 (×4): qty 2

## 2019-01-10 MED ORDER — OXYTOCIN 40 UNITS IN NORMAL SALINE INFUSION - SIMPLE MED: 2.5000 [IU]/h | INTRAVENOUS | Status: AC

## 2019-01-10 MED ORDER — SCOPOLAMINE 1 MG/3DAYS TD PT72: 1.0000 | MEDICATED_PATCH | Freq: Once | TRANSDERMAL | Status: DC

## 2019-01-10 MED ORDER — PRENATAL MULTIVITAMIN CH
1.0000 | ORAL_TABLET | Freq: Every day | ORAL | Status: DC
  Administered 2019-01-11: 12:00:00 1 via ORAL
  Filled 2019-01-10: qty 1

## 2019-01-10 MED ORDER — BUPIVACAINE IN DEXTROSE 0.75-8.25 % IT SOLN
INTRATHECAL | Status: DC | PRN
  Administered 2019-01-10: 1.4 mL via INTRATHECAL

## 2019-01-10 MED ORDER — LACTATED RINGERS IV SOLN
INTRAVENOUS | Status: DC
  Administered 2019-01-10: 17:00:00 via INTRAVENOUS

## 2019-01-10 MED ORDER — MENTHOL 3 MG MT LOZG: 1.0000 | LOZENGE | OROMUCOSAL | Status: DC | PRN

## 2019-01-10 MED ORDER — CEFAZOLIN SODIUM-DEXTROSE 2-4 GM/100ML-% IV SOLN
2.0000 g | INTRAVENOUS | Status: AC
  Administered 2019-01-10: 08:00:00 2 g via INTRAVENOUS

## 2019-01-10 MED ORDER — MORPHINE SULFATE (PF) 0.5 MG/ML IJ SOLN
INTRAMUSCULAR | Status: AC
  Filled 2019-01-10: qty 10

## 2019-01-10 MED ORDER — OXYTOCIN 40 UNITS IN NORMAL SALINE INFUSION - SIMPLE MED
INTRAVENOUS | Status: AC
  Filled 2019-01-10: qty 1000

## 2019-01-10 MED ORDER — METOCLOPRAMIDE HCL 5 MG/ML IJ SOLN
INTRAMUSCULAR | Status: DC | PRN
  Administered 2019-01-10: 10 mg via INTRAVENOUS

## 2019-01-10 MED ORDER — ONDANSETRON HCL 4 MG/2ML IJ SOLN
INTRAMUSCULAR | Status: AC
  Filled 2019-01-10: qty 2

## 2019-01-10 MED ORDER — SIMETHICONE 80 MG PO CHEW
80.0000 mg | CHEWABLE_TABLET | ORAL | Status: DC | PRN
  Administered 2019-01-10 – 2019-01-11 (×2): 80 mg via ORAL

## 2019-01-10 MED ORDER — SIMETHICONE 80 MG PO CHEW
80.0000 mg | CHEWABLE_TABLET | ORAL | Status: DC
  Administered 2019-01-10 – 2019-01-12 (×2): 80 mg via ORAL
  Filled 2019-01-10 (×2): qty 1

## 2019-01-10 MED ORDER — OXYCODONE HCL 5 MG PO TABS
5.0000 mg | ORAL_TABLET | ORAL | Status: DC | PRN
  Administered 2019-01-12: 10 mg via ORAL
  Filled 2019-01-10: qty 2

## 2019-01-10 MED ORDER — SCOPOLAMINE 1 MG/3DAYS TD PT72
MEDICATED_PATCH | TRANSDERMAL | Status: AC
  Filled 2019-01-10: qty 1

## 2019-01-10 MED ORDER — DIPHENHYDRAMINE HCL 25 MG PO CAPS: 25.0000 mg | ORAL_CAPSULE | ORAL | Status: DC | PRN

## 2019-01-10 MED ORDER — LACTATED RINGERS IV SOLN
INTRAVENOUS | Status: DC
  Administered 2019-01-10 (×2): via INTRAVENOUS

## 2019-01-10 MED ORDER — SIMETHICONE 80 MG PO CHEW
80.0000 mg | CHEWABLE_TABLET | Freq: Three times a day (TID) | ORAL | Status: DC
  Administered 2019-01-11 – 2019-01-12 (×3): 80 mg via ORAL
  Filled 2019-01-10 (×5): qty 1

## 2019-01-10 MED ORDER — HYDROMORPHONE HCL 1 MG/ML IJ SOLN: 0.2500 mg | INTRAMUSCULAR | Status: DC | PRN

## 2019-01-10 MED ORDER — ZOLPIDEM TARTRATE 5 MG PO TABS: 5.0000 mg | ORAL_TABLET | Freq: Every evening | ORAL | Status: DC | PRN

## 2019-01-10 SURGICAL SUPPLY — 36 items
BENZOIN TINCTURE PRP APPL 2/3 (GAUZE/BANDAGES/DRESSINGS) ×3 IMPLANT
CHLORAPREP W/TINT 26ML (MISCELLANEOUS) ×3 IMPLANT
CLAMP CORD UMBIL (MISCELLANEOUS) IMPLANT
CLOSURE STERI STRIP 1/2 X4 (GAUZE/BANDAGES/DRESSINGS) ×3 IMPLANT
CLOTH BEACON ORANGE TIMEOUT ST (SAFETY) ×3 IMPLANT
DRSG OPSITE POSTOP 4X10 (GAUZE/BANDAGES/DRESSINGS) ×3 IMPLANT
ELECT REM PT RETURN 9FT ADLT (ELECTROSURGICAL) ×3
ELECTRODE REM PT RTRN 9FT ADLT (ELECTROSURGICAL) ×1 IMPLANT
EXTRACTOR VACUUM M CUP 4 TUBE (SUCTIONS) IMPLANT
EXTRACTOR VACUUM M CUP 4' TUBE (SUCTIONS)
GLOVE BIO SURGEON STRL SZ 6.5 (GLOVE) ×2 IMPLANT
GLOVE BIO SURGEONS STRL SZ 6.5 (GLOVE) ×1
GLOVE BIOGEL PI IND STRL 7.0 (GLOVE) ×1 IMPLANT
GLOVE BIOGEL PI INDICATOR 7.0 (GLOVE) ×2
GOWN STRL REUS W/TWL LRG LVL3 (GOWN DISPOSABLE) ×6 IMPLANT
KIT ABG SYR 3ML LUER SLIP (SYRINGE) IMPLANT
NEEDLE HYPO 25X5/8 SAFETYGLIDE (NEEDLE) IMPLANT
NS IRRIG 1000ML POUR BTL (IV SOLUTION) ×3 IMPLANT
PACK C SECTION WH (CUSTOM PROCEDURE TRAY) ×3 IMPLANT
PAD OB MATERNITY 4.3X12.25 (PERSONAL CARE ITEMS) ×3 IMPLANT
PENCIL SMOKE EVAC W/HOLSTER (ELECTROSURGICAL) ×3 IMPLANT
RTRCTR C-SECT PINK 25CM LRG (MISCELLANEOUS) ×3 IMPLANT
STRIP CLOSURE SKIN 1/2X4 (GAUZE/BANDAGES/DRESSINGS) ×2 IMPLANT
SUT MNCRL 0 VIOLET CTX 36 (SUTURE) ×2 IMPLANT
SUT MONOCRYL 0 CTX 36 (SUTURE) ×4
SUT PLAIN 1 NONE 54 (SUTURE) IMPLANT
SUT PLAIN 2 0 XLH (SUTURE) ×3 IMPLANT
SUT VIC AB 0 CT1 27 (SUTURE) ×4
SUT VIC AB 0 CT1 27XBRD ANBCTR (SUTURE) ×2 IMPLANT
SUT VIC AB 2-0 CT1 27 (SUTURE) ×2
SUT VIC AB 2-0 CT1 TAPERPNT 27 (SUTURE) ×1 IMPLANT
SUT VIC AB 4-0 KS 27 (SUTURE) ×3 IMPLANT
SYR BULB IRRIGATION 50ML (SYRINGE) ×3 IMPLANT
TOWEL OR 17X24 6PK STRL BLUE (TOWEL DISPOSABLE) ×3 IMPLANT
TRAY FOLEY W/BAG SLVR 14FR LF (SET/KITS/TRAYS/PACK) ×3 IMPLANT
WATER STERILE IRR 1000ML POUR (IV SOLUTION) ×3 IMPLANT

## 2019-01-10 NOTE — Progress Notes (Signed)
Patient ID: Colleen Crawford, female   DOB: 1976/05/22, 43 y.o.   MRN: [id number] POD#0 Pt reports mild lightheadedness and some cramping. Improved with rest and towel on forehead. She denies any CP, SOB, chills. Baby in basinet at bedside.  VSS GEN - NAD, AAOx 3  A/P: POD#0 - still transitioning; stable otherwise         Routine pp/post op care         Monitor vitals

## 2019-01-10 NOTE — Op Note (Signed)
Colleen Crawford, SPARKS MEDICAL RECORD [id number] ACCOUNT [id number] DATE OF BIRTH:1976/01/27 FACILITY: [facility] LOCATION: [facility] PHYSICIAN:Wesleigh Markovic BOVARD-STUCKERT, MD  OPERATIVE REPORT  DATE OF PROCEDURE:  01/10/2019  PREOPERATIVE DIAGNOSES:  Intrauterine pregnancy at term, history of cesarean section x2, preeclampsia, gestational diabetes, for repeat cesarean section. POSTOPERATIVE DIAGNOSES:  Intrauterine pregnancy at term, history of cesarean section x2, preeclampsia, gestational diabetes, for repeat cesarean section, delivered. PROCEDURE:  Repeat low transverse cesarean section. SURGEON  Sherian Rein, MD ASSISTANT:  Mamie Levers, RNFA. FINDINGS:  Viable female infant at 8:47 a.m. with Apgars of 9 at 1 minute and 9 at 5 minutes and a weight pending at the time of dictation.  Normal uterus, tubes and ovaries are noted. ANESTHESIA:  Spinal. IV FLUIDS AND URINE OUTPUT:  Per Anesthesia. ESTIMATED BLOOD LOSS:  Approximately 465 mL. COMPLICATIONS:  None. PATHOLOGY:  Placenta to labor and delivery. DESCRIPTION OF PROCEDURE:  After informed consent was reviewed with the patient including risks, benefits and alternatives of the surgical procedure, she was transported to the operating room where spinal anesthesia was placed and found to be adequate.   After placement, she was returned to the supine position with a leftward tilt.  When the head raised to an adequate level and a timeout was performed.  A Pfannenstiel skin incision was made at the level of her previous Pfannenstiel incision and carried  through to the underlying layer of fascia sharply.  The fascia was incised in the midline and the incision was extended laterally with Mayo scissors.  The superior aspect of the fascial incision was grasped with Kocher clamps, elevated and the rectus  muscles were dissected off both bluntly and sharply.  The peritoneum was entered bluntly.  This incision was extended superiorly and inferiorly with  good visualization of the bladder.  The Alexis skin retractor was placed carefully making sure that no  bowel was entrapped.  The vesicouterine peritoneum was easily identified and a bladder flap was created digitally and sharply.  The uterus was incised in a transverse fashion and the infant was delivered from a transverse incision.  Her nose and mouth  were suctioned on the field.  Cord was clamped after a minute.  Infant was handed off to the awaiting pediatric staff.  Placenta was expressed.  Uterus was cleared of all clot and debris.  Uterine incision was closed with 2 layers of 0 Monocryl, first of  which was running locked and the second as an imbricating layer.  The incision was noted to be hemostatic.  The gutters were cleared of all clot and debris.  Normal anatomy was confirmed.  The Alexis retractor was removed.  The peritoneum was outlined  and closed with 2-0 Vicryl in a running fashion.  Subfascial planes were inspected and found to be hemostatic.  The fascia was reapproximated with 0 Vicryl from either corner overlapping in the midline.  The subcuticular adipose layer was made hemostatic  with Bovie cautery and the dead space was closed with 2-0 plain gut.  The skin was closed with 4-0 Vicryl on a Keith needle.  Benzoin and Steri-Strips were applied.  Sponge, lap and needle counts were correct x2 per the operating staff.  AN/NUANCE  D:01/10/2019 T:01/10/2019 JOB:[id number]

## 2019-01-10 NOTE — Anesthesia Preprocedure Evaluation (Signed)
Anesthesia Evaluation  Patient identified by MRN, date of birth, ID band Patient awake    Reviewed: Allergy & Precautions, H&P , NPO status , Patient's Chart, lab work & pertinent test results  Airway Mallampati: I  TM Distance: >3 FB Neck ROM: full    Dental no notable dental hx.    Pulmonary neg pulmonary ROS,    Pulmonary exam normal        Cardiovascular hypertension, Normal cardiovascular exam     Neuro/Psych negative neurological ROS  negative psych ROS   GI/Hepatic negative GI ROS, Neg liver ROS,   Endo/Other  diabetes  Renal/GU negative Renal ROS     Musculoskeletal   Abdominal Normal abdominal exam  (+)   Peds  Hematology negative hematology ROS (+)   Anesthesia Other Findings   Reproductive/Obstetrics (+) Pregnancy                             Anesthesia Physical  Anesthesia Plan  ASA: II  Anesthesia Plan: Spinal   Post-op Pain Management:    Induction:   PONV Risk Score and Plan: 3 and Ondansetron and Scopolamine patch - Pre-op  Airway Management Planned: Nasal Cannula and Natural Airway  Additional Equipment:   Intra-op Plan:   Post-operative Plan:   Informed Consent: I have reviewed the patients History and Physical, chart, labs and discussed the procedure including the risks, benefits and alternatives for the proposed anesthesia with the patient or authorized representative who has indicated his/her understanding and acceptance.       Plan Discussed with: CRNA and Surgeon  Anesthesia Plan Comments:         Anesthesia Quick Evaluation

## 2019-01-10 NOTE — Brief Op Note (Signed)
01/10/2019  9:34 AM  PATIENT:  Colleen Crawford  43 y.o. female  PRE-OPERATIVE DIAGNOSIS:  history of cesarean section x 2, pre eclampsia, gestational diabetes, repeat cesarean section  POST-OPERATIVE DIAGNOSIS:  history of cesarean section x 2, pre eclampsia, gestational diabetes, repeat cesarean section  PROCEDURE:  Procedure(s) with comments: CESAREAN SECTION (N/A) - Tracey RNFA  SURGEON:  Surgeon(s) and Role:    * Bovard-Stuckert, Neeko Pharo, MD - Primary  ASSISTANTS: Genice Rouge RNFA   FINDINGS: viable female infant at 8:47, apgars 9/9, wt P; nl uterus, tubes and ovaries.    ANESTHESIA:   spinal  EBL:  465 mL IVF and uop per anesthesia   BLOOD ADMINISTERED:none  DRAINS: none   LOCAL MEDICATIONS USED:  NONE  SPECIMEN:  Source of Specimen:  Placenta  DISPOSITION OF SPECIMEN:  L&D  COUNTS:  YES  TOURNIQUET:  * No tourniquets in log *  DICTATION: .Other Dictation: Dictation Number [id number]  PLAN OF CARE: Admit to inpatient   PATIENT DISPOSITION:  PACU - hemodynamically stable.   Delay start of Pharmacological VTE agent (>24hrs) due to surgical blood loss or risk of bleeding: not applicable

## 2019-01-10 NOTE — Anesthesia Procedure Notes (Signed)
Spinal  Patient location during procedure: OR Start time: 01/10/2019 8:22 AM End time: 01/10/2019 8:24 AM Staffing Anesthesiologist: Leilani Able, MD Performed: anesthesiologist  Preanesthetic Checklist Completed: patient identified, site marked, surgical consent, pre-op evaluation, timeout performed, IV checked, risks and benefits discussed and monitors and equipment checked Spinal Block Patient position: sitting Prep: site prepped and draped and DuraPrep Patient monitoring: continuous pulse ox and blood pressure Approach: midline Location: L3-4 Injection technique: single-shot Needle Needle type: Pencan  Needle gauge: 24 G Needle length: 10 cm Needle insertion depth: 6 cm Assessment Sensory level: T4

## 2019-01-10 NOTE — Interval H&P Note (Signed)
History and Physical Interval Note:  01/10/2019 8:03 AM  Colleen Crawford  has presented today for surgery, with the diagnosis of repeat C-Section.  The various methods of treatment have been discussed with the patient and family. After consideration of risks, benefits and other options for treatment, the patient has consented to  Procedure(s) with comments: CESAREAN SECTION (N/A) - Tracey RNFA as a surgical intervention.  The patient's history has been reviewed, patient examined, no change in status, stable for surgery.  I have reviewed the patient's chart and labs.  Questions were answered to the patient's satisfaction.     [name]

## 2019-01-10 NOTE — Anesthesia Postprocedure Evaluation (Signed)
Anesthesia Post Note  Patient: Rajene Quinter  Procedure(s) Performed: CESAREAN SECTION (N/A Abdomen)     Patient location during evaluation: PACU Anesthesia Type: Spinal Level of consciousness: awake Pain management: pain level controlled Vital Signs Assessment: post-procedure vital signs reviewed and stable Respiratory status: spontaneous breathing Cardiovascular status: stable Postop Assessment: no headache, no backache, spinal receding and no apparent nausea or vomiting Anesthetic complications: no    Last Vitals:  Vitals:   01/10/19 1032 01/10/19 1045  BP:  136/71  Pulse: (!) 59 61  Resp: 12 14  Temp:  36.8 C  SpO2: 97% 97%    Last Pain:  Vitals:   01/10/19 1045  TempSrc: Oral  PainSc:    Pain Goal:                Epidural/Spinal Function Cutaneous sensation: Able to Discern Pressure (01/10/19 1030), Patient able to flex knees: Yes (01/10/19 1030), Patient able to lift hips off bed: No (01/10/19 1030), Back pain beyond tenderness at insertion site: No (01/10/19 1030), Progressively worsening motor and/or sensory loss: No (01/10/19 1030), Bowel and/or bladder incontinence post epidural: No (01/10/19 1030)  Caren Macadam

## 2019-01-10 NOTE — Transfer of Care (Signed)
Immediate Anesthesia Transfer of Care Note  Patient: Colleen Crawford  Procedure(s) Performed: CESAREAN SECTION (N/A Abdomen)  Patient Location: PACU  Anesthesia Type:Spinal  Level of Consciousness: awake, alert  and oriented  Airway & Oxygen Therapy: Patient Spontanous Breathing  Post-op Assessment: Report given to RN and Post -op Vital signs reviewed and stable  Post vital signs: Reviewed and stable  Last Vitals:  Vitals Value Taken Time  BP 127/80 01/10/2019  9:37 AM  Temp    Pulse 106 01/10/2019  9:38 AM  Resp 21 01/10/2019  9:38 AM  SpO2 99 % 01/10/2019  9:38 AM  Vitals shown include unvalidated device data.  Last Pain:  Vitals:   01/10/19 0622  TempSrc: Oral  PainSc: 0-No pain         Complications: No apparent anesthesia complications

## 2019-01-11 LAB — CBC
HCT: 27.8 % — ABNORMAL LOW (ref 36.0–46.0)
Hemoglobin: 8.7 g/dL — ABNORMAL LOW (ref 12.0–15.0)
MCH: 27.4 pg (ref 26.0–34.0)
MCHC: 31.3 g/dL (ref 30.0–36.0)
MCV: 87.7 fL (ref 80.0–100.0)
Platelets: 204 10*3/uL (ref 150–400)
RBC: 3.17 MIL/uL — ABNORMAL LOW (ref 3.87–5.11)
RDW: 14.5 % (ref 11.5–15.5)
WBC: 13.1 10*3/uL — ABNORMAL HIGH (ref 4.0–10.5)
nRBC: 0 % (ref 0.0–0.2)

## 2019-01-11 LAB — BIRTH TISSUE RECOVERY COLLECTION (PLACENTA DONATION)

## 2019-01-11 NOTE — Progress Notes (Signed)
Subjective: Postpartum Day #1: Cesarean Delivery Patient reports tolerating PO and no problems voiding.    Objective: Vital signs in last 24 hours: Temp:  [97.5 F (36.4 C)-98.3 F (36.8 C)] 97.8 F (36.6 C) (04/16 0745) Pulse Rate:  [56-108] 60 (04/16 0745) Resp:  [11-26] 18 (04/16 0745) BP: (107-136)/(50-80) 112/57 (04/16 0745) SpO2:  [94 %-99 %] 98 % (04/16 0745)  Physical Exam:  General: alert Lochia: appropriate Uterine Fundus: firm Incision: dressing C/D/I   Recent Labs    01/10/19 0626 01/11/19 0554  HGB 11.0* 8.7*  HCT 35.3* 27.8*    Assessment/Plan: Status post Cesarean section. Doing well postoperatively.  Continue current care, ambulate.  Leighton Roach Rosamary Boudreau 01/11/2019, 8:20 AM

## 2019-01-11 NOTE — Lactation Note (Signed)
This note was copied from a baby's chart. Lactation Consultation Note Baby 16 hrs old. Experienced BF mom BF her 42 yr old for 2 yrs, her 2 yr old until mom found out she was expecting this baby.  Mom states this baby is BF well. Having no difficulty or questions. Encouraged mom to BF STS and chart I&O.  Mom will call for questions or assistance. Brochure left at bedside.  Patient Name: Colleen Crawford [id number] Date: 01/11/2019 Reason for consult: Initial assessment;Early term 37-38.6wks   Maternal Data Has patient been taught Hand Expression?: Yes Does the patient have breastfeeding experience prior to this delivery?: Yes  Feeding Feeding Type: Breast Fed  LATCH Score Latch: Grasps breast easily, tongue down, lips flanged, rhythmical sucking.  Audible Swallowing: A few with stimulation  Type of Nipple: Everted at rest and after stimulation  Comfort (Breast/Nipple): Soft / non-tender  Hold (Positioning): No assistance needed to correctly position infant at breast.  LATCH Score: 9  Interventions Interventions: Breast feeding basics reviewed  Lactation Tools Discussed/Used WIC Program: Yes   Consult Status Consult Status: PRN Date: 01/12/19 Follow-up type: In-patient    Charyl Dancer 01/11/2019, 1:31 AM

## 2019-01-12 MED ORDER — IBUPROFEN 800 MG PO TABS: 800.0000 mg | ORAL_TABLET | Freq: Three times a day (TID) | ORAL | 1 refills | Status: DC | PRN

## 2019-01-12 MED ORDER — PRENATAL MULTIVITAMIN CH: 1.0000 | ORAL_TABLET | Freq: Every day | ORAL | 3 refills | Status: AC

## 2019-01-12 MED ORDER — OXYCODONE HCL 5 MG PO TABS: 5.0000 mg | ORAL_TABLET | Freq: Four times a day (QID) | ORAL | 0 refills | Status: AC | PRN

## 2019-01-12 NOTE — Discharge Summary (Signed)
OB Discharge Summary     Patient Name: Colleen Crawford DOB: 04-28-1976 MRN: [id number]  Date of admission: 01/10/2019 Delivering MD: Sherian Rein   Date of discharge: 01/12/2019  Admitting diagnosis: repeat C-Section Intrauterine pregnancy: [redacted]w[redacted]d     Secondary diagnosis:  Active Problems:   Status post repeat low transverse cesarean section  Additional problems: Gestational Diabetes, PreEclampsia     Discharge diagnosis: Term Pregnancy Delivered                                                                                                Post partum procedures:N/A  Augmentation: N/A  Complications: None  Hospital course:  Sceduled C/S   43 y.o. yo [id number] at [redacted]w[redacted]d was admitted to the hospital 01/10/2019 for scheduled cesarean section with the following indication:Elective Repeat and PIH, GDM.  Membrane Rupture Time/Date: 8:46 AM ,01/10/2019   Patient delivered a Viable infant.01/10/2019  Details of operation can be found in separate operative note.  Pateint had an uncomplicated postpartum course.  [name], tolerating a regular diet, passing flatus, and urinating well. Patient is discharged home in stable condition on  01/12/19         Physical exam  Vitals:   01/11/19 0745 01/11/19 1405 01/11/19 2316 01/12/19 0518  BP: (!) 112/57 115/67 (!) 122/50 (!) 135/59  Pulse: 60 (!) 59 63 66  Resp: 18 16 16 18   Temp: 97.8 F (36.6 C) 99.1 F (37.3 C) 98.5 F (36.9 C) 98.3 F (36.8 C)  TempSrc: Oral Oral Oral Oral  SpO2: 98%     Weight:      Height:       General: alert and no distress Lochia: appropriate Uterine Fundus: firm Incision: Healing well with no significant drainage DVT Evaluation: No evidence of DVT seen on physical exam. Labs: Lab Results  Component Value Date   WBC 13.1 (H) 01/11/2019   HGB 8.7 (L) 01/11/2019   HCT 27.8 (L) 01/11/2019   MCV 87.7 01/11/2019   PLT 204 01/11/2019   CMP Latest Ref Rng & Units 01/10/2019  Glucose 70 - 99  mg/dL 299(M)  BUN 6 - 20 mg/dL 10  Creatinine 4.26 - 8.34 mg/dL 1.96  Sodium 222 - 979 mmol/L 139  Potassium 3.5 - 5.1 mmol/L 4.0  Chloride 98 - 111 mmol/L 106  CO2 22 - 32 mmol/L 22  Calcium 8.9 - 10.3 mg/dL 9.7  Total Protein 6.5 - 8.1 g/dL 8.9(Q)  Total Bilirubin 0.3 - 1.2 mg/dL 0.5  Alkaline Phos 38 - 126 U/L 87  AST 15 - 41 U/L 26  ALT 0 - 44 U/L 22    Discharge instruction: per After Visit Summary and "Baby and Me Booklet".  After visit meds:  Allergies as of 01/12/2019   No Known Allergies     Medication List    STOP taking these medications   metFORMIN 1000 MG tablet Commonly known as:  GLUCOPHAGE     TAKE these medications   ibuprofen 800 MG tablet Commonly known as:  ADVIL Take 1 tablet (800 mg total) by mouth every 8 (eight) hours  as needed. What changed:    when to take this  reasons to take this   oxyCODONE 5 MG immediate release tablet Commonly known as:  Oxy IR/ROXICODONE Take 1 tablet (5 mg total) by mouth every 6 (six) hours as needed for severe pain (pain scale 4-7).   prenatal multivitamin Tabs tablet Take 1 tablet by mouth daily.       Diet: routine diet  Activity: Advance as tolerated. Pelvic rest for 6 weeks.   Outpatient follow up:2 and 6 weeks Follow up Appt:No future appointments. Follow up Visit:No follow-ups on file.  Postpartum contraception: Nexplanon  Newborn Data: Live born female  Birth Weight: 6 lb 7.7 oz (2940 g) APGAR: 9, 9  Newborn Delivery   Birth date/time:  01/10/2019 08:47:00 Delivery type:  C-Section, Low Transverse Trial of labor:  No C-section categorization:  Repeat     Baby Feeding: Breast Disposition:home with mother   01/12/2019 Sherian ReinJody Bovard-Stuckert, MD

## 2019-01-12 NOTE — Progress Notes (Addendum)
Post Partum Day 2 Subjective: no complaints, tolerating PO, + flatus and nl lochia, pain controilled  Objective: Blood pressure (!) 135/59, pulse 66, temperature 98.3 F (36.8 C), temperature source Oral, resp. rate 18, height 5\' 2"  (1.575 m), weight 70.8 kg, last menstrual period 04/26/2018, SpO2 98 %, unknown if currently breastfeeding.  Physical Exam:  General: alert and no distress Lochia: appropriate Uterine Fundus: firm Incision: healing well DVT Evaluation: No evidence of DVT seen on physical exam.  Recent Labs    01/10/19 0626 01/11/19 0554  HGB 11.0* 8.7*  HCT 35.3* 27.8*    Assessment/Plan: Plan for discharge tomorrow, Breastfeeding and Lactation consult.  routine PP care  Pt desires d/c to home - will d/c with Motrin and PNV and oxycodone.  F/u 2 and 6 weeks (Nexplanon)   LOS: 2 days   Layth Cerezo Bovard-Stuckert 01/12/2019, 8:44 AM

## 2019-01-12 NOTE — Lactation Note (Signed)
This note was copied from a baby's chart. Lactation Consultation Note  Patient Name: Colleen Crawford [id number] Date: 01/12/2019   Infant is nursing very well. Frequent swallows are clearly audible to the naked ear. Mom is a P3 and her first 2 children were also early-term. Mom was assisted with lowering infant's chin to widen gape. Otherwise, Mom has no questions or concerns.   [name] [facility] 01/12/2019, 11:05 AM

## 2019-01-12 NOTE — Lactation Note (Signed)
This note was copied from a baby's chart. Lactation Consultation Note  Patient Name: Girl Brody Hurry [id number] Date: 01/12/2019 Reason for consult: Follow-up assessment;Early term 37-38.6wks P3, 40 hour ETI infant, weight loss -5%. Infant had 11 voids and 5 stools since delivery. Infant asslep in basinet mom breastfeed prior to [facility] entering the room. Mom feels BF is going well and infant is BF for 30 minutes by hunger cues, every 3 hours or less. Mom will continue to breastfeed according hunger cues, 8 or more times within 24 hours.  Mom knows to call Nurse or LC if she has any questions, concerns or need any assistance with breastfeeding.   Maternal Data    Feeding    LATCH Score                   Interventions    Lactation Tools Discussed/Used     Consult Status      Danelle Earthly 01/12/2019, 1:41 AM

## 2019-01-13 ENCOUNTER — Inpatient Hospital Stay (HOSPITAL_COMMUNITY)
Admission: AD | Admit: 2019-01-13 | Discharge: 2019-01-13 | Disposition: A | Discharge status: Home / Self Care | Payer: Medicaid Other | Attending: Obstetrics and Gynecology | Admitting: Obstetrics and Gynecology

## 2019-01-13 ENCOUNTER — Other Ambulatory Visit: Payer: Self-pay

## 2019-01-13 DIAGNOSIS — O99285 Endocrine, nutritional and metabolic diseases complicating the puerperium: Secondary | ICD-10-CM | POA: Diagnosis not present

## 2019-01-13 DIAGNOSIS — O34219 Maternal care for unspecified type scar from previous cesarean delivery: Secondary | ICD-10-CM | POA: Diagnosis not present

## 2019-01-13 DIAGNOSIS — Z8632 Personal history of gestational diabetes: Secondary | ICD-10-CM | POA: Insufficient documentation

## 2019-01-13 DIAGNOSIS — O165 Unspecified maternal hypertension, complicating the puerperium: Secondary | ICD-10-CM

## 2019-01-13 DIAGNOSIS — Z833 Family history of diabetes mellitus: Secondary | ICD-10-CM | POA: Diagnosis not present

## 2019-01-13 DIAGNOSIS — E0590 Thyrotoxicosis, unspecified without thyrotoxic crisis or storm: Secondary | ICD-10-CM | POA: Insufficient documentation

## 2019-01-13 LAB — CBC
HCT: 28.7 % — ABNORMAL LOW (ref 36.0–46.0)
Hemoglobin: 9.1 g/dL — ABNORMAL LOW (ref 12.0–15.0)
MCH: 28 pg (ref 26.0–34.0)
MCHC: 31.7 g/dL (ref 30.0–36.0)
MCV: 88.3 fL (ref 80.0–100.0)
Platelets: 277 10*3/uL (ref 150–400)
RBC: 3.25 MIL/uL — ABNORMAL LOW (ref 3.87–5.11)
RDW: 14.6 % (ref 11.5–15.5)
WBC: 9.7 10*3/uL (ref 4.0–10.5)
nRBC: 0 % (ref 0.0–0.2)

## 2019-01-13 LAB — COMPREHENSIVE METABOLIC PANEL
ALT: 31 U/L (ref 0–44)
AST: 28 U/L (ref 15–41)
Albumin: 2.5 g/dL — ABNORMAL LOW (ref 3.5–5.0)
Alkaline Phosphatase: 77 U/L (ref 38–126)
Anion gap: 12 (ref 5–15)
BUN: 12 mg/dL (ref 6–20)
CO2: 24 mmol/L (ref 22–32)
Calcium: 8.8 mg/dL — ABNORMAL LOW (ref 8.9–10.3)
Chloride: 105 mmol/L (ref 98–111)
Creatinine, Ser: 0.63 mg/dL (ref 0.44–1.00)
GFR calc Af Amer: >60 mL/min (ref >60)
GFR calc non Af Amer: >60 mL/min (ref >60)
Glucose, Bld: 101 mg/dL — ABNORMAL HIGH (ref 70–99)
Potassium: 3.6 mmol/L (ref 3.5–5.1)
Sodium: 141 mmol/L (ref 135–145)
Total Bilirubin: 0.3 mg/dL (ref 0.3–1.2)
Total Protein: 5.7 g/dL — ABNORMAL LOW (ref 6.5–8.1)

## 2019-01-13 LAB — PROTEIN / CREATININE RATIO, URINE
Creatinine, Urine: 35.1 mg/dL
Protein Creatinine Ratio: 0.68 mg/mg{Cre} — ABNORMAL HIGH (ref 0.00–0.15)
Total Protein, Urine: 24 mg/dL

## 2019-01-13 MED ORDER — NIFEDIPINE ER OSMOTIC RELEASE 30 MG PO TB24: 30.0000 mg | ORAL_TABLET | Freq: Every day | ORAL | 1 refills | Status: AC

## 2019-01-13 MED ORDER — LACTATED RINGERS IV SOLN
INTRAVENOUS | Status: DC
  Administered 2019-01-13: 21:00:00 via INTRAVENOUS

## 2019-01-13 MED ORDER — BUTALBITAL-APAP-CAFFEINE 50-325-40 MG PO TABS
2.0000 | ORAL_TABLET | Freq: Once | ORAL | Status: AC
  Administered 2019-01-13: 21:00:00 2 via ORAL
  Filled 2019-01-13: qty 2

## 2019-01-13 MED ORDER — LABETALOL HCL 5 MG/ML IV SOLN: 20.0000 mg | INTRAVENOUS | Status: DC | PRN

## 2019-01-13 MED ORDER — HYDRALAZINE HCL 20 MG/ML IJ SOLN: 10.0000 mg | INTRAMUSCULAR | Status: DC | PRN

## 2019-01-13 MED ORDER — HYDRALAZINE HCL 20 MG/ML IJ SOLN: 5.0000 mg | INTRAMUSCULAR | Status: DC | PRN

## 2019-01-13 MED ORDER — HYDROCHLOROTHIAZIDE 12.5 MG PO CAPS: 12.5000 mg | ORAL_CAPSULE | Freq: Every day | ORAL | 0 refills | Status: AC

## 2019-01-13 MED ORDER — LABETALOL HCL 5 MG/ML IV SOLN: 40.0000 mg | INTRAVENOUS | Status: DC | PRN

## 2019-01-13 MED ORDER — HYDROCHLOROTHIAZIDE 12.5 MG PO CAPS
12.5000 mg | ORAL_CAPSULE | Freq: Every day | ORAL | Status: DC
  Administered 2019-01-13: 12.5 mg via ORAL
  Filled 2019-01-13 (×2): qty 1

## 2019-01-13 NOTE — Discharge Instructions (Signed)
-Clinic will call you on Monday for a blood pressure check on Tuesday, 01-16-2019.  -Take your medicine in the morning -Take Tylenol for pain; if you need to take ibuprofen, just take 400 mg instead of 800.     Postpartum Hypertension Postpartum hypertension is high blood pressure that remains higher than normal after childbirth. You may not realize that you have postpartum hypertension if your blood pressure is not being checked regularly. In most cases, postpartum hypertension will go away on its own, usually within a week of delivery. However, for some women, medical treatment is required to prevent serious complications, such as seizures or stroke. What are the causes? This condition may be caused by one or more of the following:  Hypertension that existed before pregnancy (chronic hypertension).  Hypertension that comes on as a result of pregnancy (gestational hypertension).  Hypertensive disorders during pregnancy (preeclampsia) or seizures in women who have high blood pressure during pregnancy (eclampsia).  A condition in which the liver, platelets, and red blood cells are damaged during pregnancy (HELLP syndrome).  A condition in which the thyroid produces too much hormones (hyperthyroidism).  Other rare problems of the nerves (neurological disorders) or blood disorders. In some cases, the cause may not be known. What increases the risk? The following factors may make you more likely to develop this condition:  Chronic hypertension. In some cases, this may not have been diagnosed before pregnancy.  Obesity.  Type 2 diabetes.  Kidney disease.  History of preeclampsia or eclampsia.  Other medical conditions that change the level of hormones in the body (hormonal imbalance). What are the signs or symptoms? As with all types of hypertension, postpartum hypertension may not have any symptoms. Depending on how high your blood pressure is, you may experience:  Headaches. These  may be mild, moderate, or severe. They may also be steady, constant, or sudden in onset (thunderclap headache).  Changes in your ability to see (visual changes).  Dizziness.  Shortness of breath.  Swelling of your hands, feet, lower legs, or face. In some cases, you may have swelling in more than one of these locations.  Heart palpitations or a racing heartbeat.  Difficulty breathing while lying down.  Decrease in the amount of urine that you pass. Other rare signs and symptoms may include:  Sweating more than usual. This lasts longer than a few days after delivery.  Chest pain.  Sudden dizziness when you get up from sitting or lying down.  Seizures.  Nausea or vomiting.  Abdominal pain. How is this diagnosed? This condition may be diagnosed based on the results of a physical exam, blood pressure measurements, and blood and urine tests. You may also have other tests, such as a CT scan or an MRI, to check for other problems of postpartum hypertension. How is this treated? If blood pressure is high enough to require treatment, your options may include:  Medicines to reduce blood pressure (antihypertensives). Tell your health care provider if you are breastfeeding or if you plan to breastfeed. There are many antihypertensive medicines that are safe to take while breastfeeding.  Stopping medicines that may be causing hypertension.  Treating medical conditions that are causing hypertension.  Treating the complications of hypertension, such as seizures, stroke, or kidney problems. Your health care provider will also continue to monitor your blood pressure closely until it is within a safe range for you. Follow these instructions at home:  Take over-the-counter and prescription medicines only as told by your health care  provider.  Return to your normal activities as told by your health care provider. Ask your health care provider what activities are safe for you.  Do not use  any products that contain nicotine or tobacco, such as cigarettes and e-cigarettes. If you need help quitting, ask your health care provider.  Keep all follow-up visits as told by your health care provider. This is important. Contact a health care provider if:  Your symptoms get worse.  You have new symptoms, such as: ? A headache that does not get better. ? Dizziness. ? Visual changes. Get help right away if:  You suddenly develop swelling in your hands, ankles, or face.  You have sudden, rapid weight gain.  You develop difficulty breathing, chest pain, racing heartbeat, or heart palpitations.  You develop severe pain in your abdomen.  You have any symptoms of a stroke. "BE FAST" is an easy way to remember the main warning signs of a stroke: ? B - Balance. Signs are dizziness, sudden trouble walking, or loss of balance. ? E - Eyes. Signs are trouble seeing or a sudden change in vision. ? F - Face. Signs are sudden weakness or numbness of the face, or the face or eyelid drooping on one side. ? A - Arms. Signs are weakness or numbness in an arm. This happens suddenly and usually on one side of the body. ? S - Speech. Signs are sudden trouble speaking, slurred speech, or trouble understanding what people say. ? T - Time. Time to call emergency services. Write down what time symptoms started.  You have other signs of a stroke, such as: ? A sudden, severe headache with no known cause. ? Nausea or vomiting. ? Seizure. These symptoms may represent a serious problem that is an emergency. Do not wait to see if the symptoms will go away. Get medical help right away. Call your local emergency services (911 in the [place] Do not drive yourself to the hospital. Summary  Postpartum hypertension is high blood pressure that remains higher than normal after childbirth.  In most cases, postpartum hypertension will go away on its own, usually within a week of delivery.  For some women, medical  treatment is required to prevent serious complications, such as seizures or stroke. This information is not intended to replace advice given to you by your health care provider. Make sure you discuss any questions you have with your health care provider. Document Released: 05/17/2014 Document Revised: 07/04/2017 Document Reviewed: 07/04/2017 Elsevier Interactive Patient Education  2019 [employer]

## 2019-01-13 NOTE — MAU Note (Signed)
Presents with c/o HA's, dizziness, visual changes (floaters), edema of feet since 2200 yesterday. HA 5/10 "all over". Pt took Ibuprofen 800 around 0100 today with no improvement of sx's.   Adah Perl RN

## 2019-01-13 NOTE — MAU Provider Note (Addendum)
History    Patient Yomira Marinelli is a 43 y.o.  [id number] At 3 days postpartum here with complaints of headache, blurry vision and swelling. She is s/p repeat c/section x 3 on 12-10-2018. She has a history of gestational diabetes and preeclampsia.  CSN: [id number]  Arrival date and time: 01/13/19 0045   First Provider Initiated Contact with Patient 01/13/19 2037      Chief Complaint  Patient presents with  . Headache  . Dizziness   Headache   This is a new problem. The current episode started today. The problem occurs constantly. The problem has been unchanged. Pain location: "all over" The pain is at a severity of 4/10. Associated symptoms include dizziness. There is no history of hypertension. (Gestational hypertension and pre-eclampsia. )  Dizziness  Associated symptoms include headaches.  Patient also says that she has some blurry vision that started today at 2 pm. It happens sometimes; it happens when she tries to look at something. It is not all the time.  She says that she has some swelling in her legs that started yesterday; it greatly concerned her.  OB History    Gravida  5   Para  3   Term  3   Preterm  0   AB  2   Living  3     SAB  2   TAB  0   Ectopic  0   Multiple  0   Live Births  3           Past Medical History:  Diagnosis Date  . Gestational diabetes 2013 and 2018  . Gestational diabetes mellitus (GDM) in childbirth, diet controlled 12/02/2016  . Hyperthyroidism   . Preexisting diabetes complicating pregnancy in third trimester, antepartum 12/02/2016  . Pregnancy induced hypertension 2013  . S/P cesarean section 12/03/2016    Past Surgical History:  Procedure Laterality Date  . APPENDECTOMY    . CESAREAN SECTION    . CESAREAN SECTION N/A 12/03/2016   Procedure: CESAREAN SECTION;  Surgeon: Sherian Rein, MD;  Location: [facility];  Service: Obstetrics;  Laterality: N/A;  Heather K to RNFA  . CESAREAN SECTION N/A 01/10/2019    Procedure: CESAREAN SECTION;  Surgeon: Sherian Rein, MD;  Location: [facility];  Service: Obstetrics;  Laterality: N/A;  Tracey RNFA  . DILATION AND EVACUATION N/A 07/07/2014   Procedure: DILATATION AND EVACUATION;  Surgeon: Levie Heritage, DO;  Location: [facility] OR[facility];  Service: Gynecology;  Laterality: N/A;    Family History  Problem Relation Age of Onset  . Asthma Sister   . Diabetes Maternal Grandfather   . Alcohol abuse Neg Hx     Social History   Tobacco Use  . Smoking status: Never Smoker  . Smokeless tobacco: Never Used  Substance Use Topics  . Alcohol use: No  . Drug use: No    Allergies: No Known Allergies  Medications Prior to Admission  Medication Sig Dispense Refill Last Dose  . ibuprofen (ADVIL) 800 MG tablet Take 1 tablet (800 mg total) by mouth every 8 (eight) hours as needed. 45 tablet 1   . oxyCODONE (OXY IR/ROXICODONE) 5 MG immediate release tablet Take 1 tablet (5 mg total) by mouth every 6 (six) hours as needed for severe pain (pain scale 4-7). 30 tablet 0   . Prenatal Vit-Fe Fumarate-FA (PRENATAL MULTIVITAMIN) TABS tablet Take 1 tablet by mouth daily. 100 tablet 3     Review of Systems  Constitutional: Negative.   HENT:  Negative.   Respiratory: Negative.   Cardiovascular: Negative.   Gastrointestinal: Negative.   Genitourinary: Positive for vaginal bleeding.  Musculoskeletal: Negative.   Neurological: Positive for dizziness and headaches.   Physical Exam   Blood pressure (!) 156/69, pulse (!) 56, temperature 98.7 F (37.1 C), resp. rate 18, height 5' 0.2" (1.529 m), weight 69.4 kg, SpO2 99 %, unknown if currently breastfeeding.  Patient Vitals for the past 24 hrs:  BP Temp Pulse Resp SpO2 Height Weight  01/13/19 2200 (!) 156/69 - (!) 56 18 - - -  01/13/19 2145 (!) 148/67 - (!) 59 18 - - -  01/13/19 2130 (!) 160/76 - 65 18 - - -  01/13/19 2115 (!) 159/80 - (!) 59 18 - - -  01/13/19 2100 (!) 157/69 - (!) 59 18 - - -  01/13/19 2045 (!) 148/56  - 61 20 - - -  01/13/19 2030 (!) 165/74 - 60 20 - - -  01/13/19 2020 - - - - 99 % - -  01/13/19 2014 (!) 153/70 - (!) 57 20 99 % 5' 0.2" (1.529 m) 69.4 kg  01/13/19 2011 - - - - 100 % - -  01/13/19 2010 - - - - 100 % - -  01/13/19 2007 (!) 162/68 98.7 F (37.1 C) 67 (!) 22 - - -     Physical Exam  Constitutional: She appears well-developed and well-nourished.  HENT:  Head: Normocephalic.  Neck: Normal range of motion.  Respiratory: Effort normal.  GI: Soft.  Musculoskeletal: Normal range of motion.        General: Edema present.  Neurological: She is alert.  Skin: Skin is warm and dry.  Psychiatric: She has a normal mood and affect.    MAU Course  Procedures  MDM -Pre-e labs were ordered stat and nurse started IV immediately. Dr. Ellyn HackBovard at the bedside within 30 minutes.  -patient's appears well but very worried about developing preeclampsia; in no apparent distress throughout visit. After 2 doses of Fioricet, patient now has headache 3/10; says she feels much better. She does not have any blurry vision now (2200).  - She also received 12.5 mg of HCTZ.  She nursed and dressed her baby while in MAU; says she feels "better bc I have my baby with me".  -CMP and CBC are normal, PCR is 0.6, which is lower than her PCR of 1.02 on 3-25.  -2+ pitting edema around ankles but patient denies overall edema; edema appears appropriate for post-op state.  Assessment and Plan   1. Postpartum hypertension    2. -Discussed with Dr. Ellyn HackBovard, patient to be discharged home with BP check on Tuesday and started on Procardia 30 and HCTZ. [facility] and set up appt with patient.  3. Strict return precautions reviewed, including worsening headache, return blurry vision, overall body swelling, epigastric pain.  4. Recommended Tylenol for headache, rest, hydration and eating frequent meals.   Charlesetta GaribaldiKathryn Lorraine Trinia Georgi 01/13/2019, 10:23 PM

## 2019-01-25 ENCOUNTER — Inpatient Hospital Stay (HOSPITAL_COMMUNITY)
Admission: AD | Admit: 2019-01-25 | Payer: Medicaid Other | Source: Home / Self Care | Admitting: Obstetrics and Gynecology

## 2022-02-16 ENCOUNTER — Other Ambulatory Visit: Payer: Self-pay | Admitting: Obstetrics and Gynecology

## 2022-02-16 DIAGNOSIS — R928 Other abnormal and inconclusive findings on diagnostic imaging of breast: Secondary | ICD-10-CM

## 2022-03-02 ENCOUNTER — Other Ambulatory Visit: Payer: Medicaid Other

## 2022-05-29 ENCOUNTER — Emergency Department (HOSPITAL_COMMUNITY): Payer: Medicaid Other

## 2022-05-29 ENCOUNTER — Encounter (HOSPITAL_COMMUNITY): Payer: Self-pay | Admitting: Emergency Medicine

## 2022-05-29 ENCOUNTER — Other Ambulatory Visit: Payer: Self-pay

## 2022-05-29 ENCOUNTER — Emergency Department (HOSPITAL_COMMUNITY)
Admission: EM | Admit: 2022-05-29 | Discharge: 2022-05-30 | Disposition: A | Discharge status: Home / Self Care | Payer: Medicaid Other | Attending: Emergency Medicine | Admitting: Emergency Medicine

## 2022-05-29 DIAGNOSIS — Z79899 Other long term (current) drug therapy: Secondary | ICD-10-CM | POA: Insufficient documentation

## 2022-05-29 DIAGNOSIS — J189 Pneumonia, unspecified organism: Secondary | ICD-10-CM

## 2022-05-29 DIAGNOSIS — J181 Lobar pneumonia, unspecified organism: Secondary | ICD-10-CM | POA: Insufficient documentation

## 2022-05-29 DIAGNOSIS — E119 Type 2 diabetes mellitus without complications: Secondary | ICD-10-CM | POA: Insufficient documentation

## 2022-05-29 DIAGNOSIS — D72829 Elevated white blood cell count, unspecified: Secondary | ICD-10-CM | POA: Diagnosis not present

## 2022-05-29 DIAGNOSIS — R109 Unspecified abdominal pain: Secondary | ICD-10-CM | POA: Diagnosis not present

## 2022-05-29 LAB — BASIC METABOLIC PANEL
Anion gap: 8 (ref 5–15)
BUN: 13 mg/dL (ref 6–20)
CO2: 26 mmol/L (ref 22–32)
Calcium: 9.2 mg/dL (ref 8.9–10.3)
Chloride: 104 mmol/L (ref 98–111)
Creatinine, Ser: 0.77 mg/dL (ref 0.44–1.00)
GFR, Estimated: >60 mL/min (ref >60)
Glucose, Bld: 109 mg/dL — ABNORMAL HIGH (ref 70–99)
Potassium: 4.1 mmol/L (ref 3.5–5.1)
Sodium: 138 mmol/L (ref 135–145)

## 2022-05-29 LAB — URINALYSIS, ROUTINE W REFLEX MICROSCOPIC
Bilirubin Urine: NEGATIVE
Glucose, UA: NEGATIVE mg/dL
Ketones, ur: NEGATIVE mg/dL
Nitrite: NEGATIVE
Protein, ur: 100 mg/dL — AB
Specific Gravity, Urine: 1.004 — ABNORMAL LOW (ref 1.005–1.030)
pH: 6 (ref 5.0–8.0)

## 2022-05-29 LAB — CBC
HCT: 39.2 % (ref 36.0–46.0)
Hemoglobin: 12.9 g/dL (ref 12.0–15.0)
MCH: 27.5 pg (ref 26.0–34.0)
MCHC: 32.9 g/dL (ref 30.0–36.0)
MCV: 83.6 fL (ref 80.0–100.0)
Platelets: 326 10*3/uL (ref 150–400)
RBC: 4.69 MIL/uL (ref 3.87–5.11)
RDW: 13.5 % (ref 11.5–15.5)
WBC: 15.1 10*3/uL — ABNORMAL HIGH (ref 4.0–10.5)
nRBC: 0 % (ref 0.0–0.2)

## 2022-05-29 LAB — I-STAT BETA HCG BLOOD, ED (MC, WL, AP ONLY): I-stat hCG, quantitative: <5 m[IU]/mL (ref <5)

## 2022-05-29 NOTE — ED Provider Notes (Signed)
[facility] EMERGENCY DEPARTMENT Provider Note   CSN: [id number] Arrival date & time: 05/29/22  1841     History {Add pertinent medical, surgical, social history, OB history to HPI:1} Chief Complaint  Patient presents with   Flank Pain    Colleen Crawford is a 46 y.o. female.  The history is provided by the patient and medical records.  Flank Pain   46 y.o. M with hx of DM, hyperthyroidism, presenting to the ED for left flank pain x3 days.  States pain is constant, but has waves where it is more severe than others.  States it is very hard to get comfortable.  Denies fever, chills, nausea, vomiting.  No hx of kidney stones.  Currently on her menstrual cycle.  No urinary symptoms.  Has been taking tylenol at home without significant relief.  Home Medications Prior to Admission medications   Medication Sig Start Date End Date Taking? Authorizing Provider  hydrochlorothiazide (MICROZIDE) 12.5 MG capsule Take 1 capsule (12.5 mg total) by mouth daily. 01/13/19   Marylene Land, CNM  NIFEdipine (PROCARDIA-XL/NIFEDICAL-XL) 30 MG 24 hr tablet Take 1 tablet (30 mg total) by mouth daily. 01/13/19   Marylene Land, CNM  oxyCODONE (OXY IR/ROXICODONE) 5 MG immediate release tablet Take 1 tablet (5 mg total) by mouth every 6 (six) hours as needed for severe pain (pain scale 4-7). 01/12/19   Bovard-Stuckert, Augusto Gamble, MD  Prenatal Vit-Fe Fumarate-FA (PRENATAL MULTIVITAMIN) TABS tablet Take 1 tablet by mouth daily. 01/12/19   Bovard-Stuckert, Augusto Gamble, MD      Allergies    Patient has no known allergies.    Review of Systems   Review of Systems  Genitourinary:  Positive for flank pain.  All other systems reviewed and are negative.   Physical Exam Updated Vital Signs BP (!) 162/74 (BP Location: Left Arm)   Pulse 63   Temp 97.6 F (36.4 C) (Oral)   Resp 16   SpO2 100%  Physical Exam Vitals and nursing note reviewed.  Constitutional:      Appearance: She is  well-developed.  HENT:     Head: Normocephalic and atraumatic.  Eyes:     Conjunctiva/sclera: Conjunctivae normal.     Pupils: Pupils are equal, round, and reactive to light.  Cardiovascular:     Rate and Rhythm: Normal rate and regular rhythm.     Heart sounds: Normal heart sounds.  Pulmonary:     Effort: Pulmonary effort is normal.     Breath sounds: Normal breath sounds.  Abdominal:     General: Bowel sounds are normal.     Palpations: Abdomen is soft.     Tenderness: There is left CVA tenderness.     Comments: Flank without noted rash, some CVA tenderness present  Musculoskeletal:        General: Normal range of motion.     Cervical back: Normal range of motion.  Skin:    General: Skin is warm and dry.  Neurological:     Mental Status: She is alert and oriented to person, place, and time.     ED Results / Procedures / Treatments   Labs (all labs ordered are listed, but only abnormal results are displayed) Labs Reviewed  URINALYSIS, ROUTINE W REFLEX MICROSCOPIC - Abnormal; Notable for the following components:      Result Value   Color, Urine STRAW (*)    APPearance HAZY (*)    Specific Gravity, Urine 1.004 (*)    Hgb urine dipstick MODERATE (*)  Protein, ur 100 (*)    Leukocytes,Ua MODERATE (*)    Bacteria, UA RARE (*)    All other components within normal limits  BASIC METABOLIC PANEL - Abnormal; Notable for the following components:   Glucose, Bld 109 (*)    All other components within normal limits  CBC - Abnormal; Notable for the following components:   WBC 15.1 (*)    All other components within normal limits  URINE CULTURE  I-STAT BETA HCG BLOOD, ED ([facility], [facility], [facility] ONLY)    EKG None  Radiology No results found.  Procedures Procedures  {Document cardiac monitor, telemetry assessment procedure when appropriate:1}  Medications Ordered in ED Medications - No data to display  ED Course/ Medical Decision Making/ A&P                            Medical Decision Making Amount and/or Complexity of Data Reviewed Labs: ordered. Radiology: ordered.   ***  {Document critical care time when appropriate:1} {Document review of labs and clinical decision tools ie heart score, Chads2Vasc2 etc:1}  {Document your independent review of radiology images, and any outside records:1} {Document your discussion with family members, caretakers, and with consultants:1} {Document social determinants of health affecting pt's care:1} {Document your decision making why or why not admission, treatments were needed:1} Final Clinical Impression(s) / ED Diagnoses Final diagnoses:  None    Rx / DC Orders ED Discharge Orders     None

## 2022-05-29 NOTE — ED Triage Notes (Signed)
Pt reported to ED with c/o left sided flank pain since yesterday morning. Endorses frequent urination but also states she has been consuming a lot of liquids.

## 2022-05-30 ENCOUNTER — Emergency Department (HOSPITAL_COMMUNITY): Payer: Medicaid Other

## 2022-05-30 MED ORDER — DOXYCYCLINE HYCLATE 100 MG PO CAPS: 100.0000 mg | ORAL_CAPSULE | Freq: Two times a day (BID) | ORAL | 0 refills | Status: AC

## 2022-05-30 MED ORDER — DOXYCYCLINE HYCLATE 100 MG PO TABS
100.0000 mg | ORAL_TABLET | Freq: Once | ORAL | Status: AC
  Administered 2022-05-30: 100 mg via ORAL
  Filled 2022-05-30: qty 1

## 2022-05-30 NOTE — Discharge Instructions (Signed)
Take the prescribed medication as directed. Follow-up with your primary care doctor Wednesday as scheduled. Return to the ED for new or worsening symptoms.

## 2022-05-30 NOTE — ED Notes (Signed)
Discharge instructions reviewed with patient. Patient verbalized understanding of instructions. Follow-up care and medications were reviewed. Patient ambulatory with steady gait. VSS upon discharge.  ?

## 2022-05-31 LAB — URINE CULTURE: Culture: 2000 — AB

## 2022-06-01 NOTE — Progress Notes (Signed)
ED Antimicrobial Stewardship Positive Culture Follow Up   Colleen Crawford is an 46 y.o. female who presented to [facility] on 05/29/2022 with a chief complaint of left flank pain.  Chief Complaint  Patient presents with   Flank Pain    Recent Results (from the past 720 hour(s))  Urine Culture     Status: Abnormal   Collection Time: 05/29/22  7:33 PM   Specimen: Urine, Clean Catch  Result Value Ref Range Status   Specimen Description URINE, CLEAN CATCH  Final   Special Requests NONE  Final   Culture (A)  Final    2,000 COLONIES/mL GROUP B STREP(S.AGALACTIAE)ISOLATED TESTING AGAINST S. AGALACTIAE NOT ROUTINELY PERFORMED DUE TO PREDICTABILITY OF AMP/PEN/VAN SUSCEPTIBILITY. Performed at [facility], [address] [place], [place] [address]    Report Status 05/31/2022 FINAL  Final   49 YOF presented with left flank pain for 3 days. Patient was noted to be on her menstrual cycle. Denied urinary symptoms. CT stone study without any noted urological issues. CXR with left lower lobe opacity. Started on doxycycline for CAP. UA also obtained, which showed 6-10 WBC, moderate leukocytes and rare bacteria. Discussed with Dr. Jeraldine Loots. Not UTI, patient on appropriate antibiotics for CAP.   New antibiotic prescription: none  ED Provider: Dr. Jerilynn Mages, PharmD PGY1 Pharmacy Resident  06/01/2022  9:52 AM  Clinical Pharmacist Monday - Friday phone -  [phone] Saturday - Sunday phone - [phone]

## 2023-09-01 ENCOUNTER — Other Ambulatory Visit: Payer: Self-pay | Admitting: Family Medicine

## 2023-09-01 DIAGNOSIS — R3129 Other microscopic hematuria: Secondary | ICD-10-CM

## 2024-08-21 ENCOUNTER — Ambulatory Visit
Admission: RE | Admit: 2024-08-21 | Discharge: 2024-08-21 | Disposition: A | Source: Ambulatory Visit | Attending: Family Medicine | Admitting: Family Medicine

## 2024-08-21 ENCOUNTER — Other Ambulatory Visit: Payer: Self-pay | Admitting: Family Medicine

## 2024-08-21 DIAGNOSIS — M545 Low back pain, unspecified: Secondary | ICD-10-CM
